# Patient Record
Sex: Female | Born: 1980 | Hispanic: Yes | Marital: Married | Smoking: Never smoker
Health system: Southern US, Community
[De-identification: ages and names within clinical notes are randomized; demographics above are authoritative.]

## PROBLEM LIST (undated history)

## (undated) DIAGNOSIS — F329 Major depressive disorder, single episode, unspecified: Secondary | ICD-10-CM

## (undated) DIAGNOSIS — F419 Anxiety disorder, unspecified: Secondary | ICD-10-CM

## (undated) DIAGNOSIS — F32A Depression, unspecified: Secondary | ICD-10-CM

## (undated) DIAGNOSIS — E119 Type 2 diabetes mellitus without complications: Secondary | ICD-10-CM

---

## 2008-04-19 ENCOUNTER — Emergency Department: Payer: Self-pay | Admitting: Emergency Medicine

## 2008-04-22 ENCOUNTER — Emergency Department: Payer: Self-pay | Admitting: Emergency Medicine

## 2008-04-26 ENCOUNTER — Emergency Department: Payer: Self-pay | Admitting: Emergency Medicine

## 2014-01-08 DIAGNOSIS — E119 Type 2 diabetes mellitus without complications: Secondary | ICD-10-CM

## 2014-01-08 DIAGNOSIS — N879 Dysplasia of cervix uteri, unspecified: Secondary | ICD-10-CM | POA: Insufficient documentation

## 2015-07-30 ENCOUNTER — Emergency Department
Admission: EM | Admit: 2015-07-30 | Discharge: 2015-07-30 | Disposition: A | Discharge status: Home / Self Care | Payer: Self-pay | Attending: Emergency Medicine | Admitting: Emergency Medicine

## 2015-07-30 ENCOUNTER — Encounter: Payer: Self-pay | Admitting: Emergency Medicine

## 2015-07-30 ENCOUNTER — Emergency Department: Payer: Self-pay

## 2015-07-30 DIAGNOSIS — E119 Type 2 diabetes mellitus without complications: Secondary | ICD-10-CM | POA: Insufficient documentation

## 2015-07-30 DIAGNOSIS — R51 Headache: Secondary | ICD-10-CM | POA: Insufficient documentation

## 2015-07-30 DIAGNOSIS — R1032 Left lower quadrant pain: Secondary | ICD-10-CM | POA: Insufficient documentation

## 2015-07-30 HISTORY — DX: Type 2 diabetes mellitus without complications: E11.9

## 2015-07-30 LAB — CBC
HEMATOCRIT: 40.2 % (ref 35.0–47.0)
HEMOGLOBIN: 13.9 g/dL (ref 12.0–16.0)
MCH: 30.7 pg (ref 26.0–34.0)
MCHC: 34.5 g/dL (ref 32.0–36.0)
MCV: 89 fL (ref 80.0–100.0)
Platelets: 281 10*3/uL (ref 150–440)
RBC: 4.52 MIL/uL (ref 3.80–5.20)
RDW: 13.3 % (ref 11.5–14.5)
WBC: 7.2 10*3/uL (ref 3.6–11.0)

## 2015-07-30 LAB — URINALYSIS COMPLETE WITH MICROSCOPIC (ARMC ONLY)
BACTERIA UA: NONE SEEN
BILIRUBIN URINE: NEGATIVE
GLUCOSE, UA: 50 mg/dL — AB
HGB URINE DIPSTICK: NEGATIVE
KETONES UR: NEGATIVE mg/dL
LEUKOCYTES UA: NEGATIVE
NITRITE: NEGATIVE
Protein, ur: 100 mg/dL — AB
RBC / HPF: NONE SEEN RBC/hpf (ref 0–5)
SPECIFIC GRAVITY, URINE: 1.014 (ref 1.005–1.030)
pH: 7 (ref 5.0–8.0)

## 2015-07-30 LAB — COMPREHENSIVE METABOLIC PANEL
ALK PHOS: 87 U/L (ref 38–126)
ALT: 19 U/L (ref 14–54)
ANION GAP: 7 (ref 5–15)
AST: 21 U/L (ref 15–41)
Albumin: 4.4 g/dL (ref 3.5–5.0)
BILIRUBIN TOTAL: 0.8 mg/dL (ref 0.3–1.2)
BUN: 10 mg/dL (ref 6–20)
CALCIUM: 9.2 mg/dL (ref 8.9–10.3)
CO2: 27 mmol/L (ref 22–32)
Chloride: 103 mmol/L (ref 101–111)
Creatinine, Ser: 0.6 mg/dL (ref 0.44–1.00)
Glucose, Bld: 232 mg/dL — ABNORMAL HIGH (ref 65–99)
POTASSIUM: 3.7 mmol/L (ref 3.5–5.1)
Sodium: 137 mmol/L (ref 135–145)
TOTAL PROTEIN: 7.4 g/dL (ref 6.5–8.1)

## 2015-07-30 LAB — PREGNANCY, URINE: PREG TEST UR: NEGATIVE

## 2015-07-30 LAB — LIPASE, BLOOD: LIPASE: 26 U/L (ref 11–51)

## 2015-07-30 MED ORDER — MORPHINE SULFATE (PF) 4 MG/ML IV SOLN
INTRAVENOUS | Status: AC
  Administered 2015-07-30: 4 mg via INTRAVENOUS
  Filled 2015-07-30: qty 1

## 2015-07-30 MED ORDER — METOCLOPRAMIDE HCL 5 MG/ML IJ SOLN
10.0000 mg | Freq: Once | INTRAMUSCULAR | Status: AC
  Administered 2015-07-30: 10 mg via INTRAVENOUS
  Filled 2015-07-30: qty 2

## 2015-07-30 MED ORDER — IOPAMIDOL (ISOVUE-300) INJECTION 61%
100.0000 mL | Freq: Once | INTRAVENOUS | Status: AC | PRN
  Administered 2015-07-30: 100 mL via INTRAVENOUS

## 2015-07-30 MED ORDER — DIATRIZOATE MEGLUMINE & SODIUM 66-10 % PO SOLN
15.0000 mL | Freq: Once | ORAL | Status: AC
  Administered 2015-07-30: 15 mL via ORAL
  Filled 2015-07-30: qty 30

## 2015-07-30 MED ORDER — ONDANSETRON 4 MG PO TBDP: 4.0000 mg | ORAL_TABLET | Freq: Four times a day (QID) | ORAL | Status: DC | PRN

## 2015-07-30 MED ORDER — SODIUM CHLORIDE 0.9 % IV BOLUS (SEPSIS)
1000.0000 mL | Freq: Once | INTRAVENOUS | Status: AC
  Administered 2015-07-30: 1000 mL via INTRAVENOUS

## 2015-07-30 MED ORDER — MORPHINE SULFATE (PF) 4 MG/ML IV SOLN
4.0000 mg | Freq: Once | INTRAVENOUS | Status: AC
  Administered 2015-07-30: 4 mg via INTRAVENOUS
  Filled 2015-07-30: qty 1

## 2015-07-30 MED ORDER — MORPHINE SULFATE (PF) 4 MG/ML IV SOLN
4.0000 mg | Freq: Once | INTRAVENOUS | Status: AC
  Administered 2015-07-30: 4 mg via INTRAVENOUS

## 2015-07-30 MED ORDER — ONDANSETRON HCL 4 MG/2ML IJ SOLN
INTRAMUSCULAR | Status: AC
  Administered 2015-07-30: 4 mg via INTRAVENOUS
  Filled 2015-07-30: qty 2

## 2015-07-30 MED ORDER — ONDANSETRON HCL 4 MG/2ML IJ SOLN
4.0000 mg | Freq: Once | INTRAMUSCULAR | Status: AC
  Administered 2015-07-30: 4 mg via INTRAVENOUS

## 2015-07-30 NOTE — ED Provider Notes (Signed)
[facility] Emergency Department Provider Note  ____________________________________________  Time seen: Approximately 10:22 AM  I have reviewed the triage vital signs and the nursing notes.   HISTORY  Chief Complaint Abdominal Pain    HPI Amy Juarez is a 35 y.o. female presents for evaluation of 2 weeks of left lower abdominal pain.  The patient reports that for the last 2 weeks she is been experiencing intermittent fever, chills, and increasing discomfort in the left lower abdomen. She has vomited a couple of times but not today. She does report she had loose watery nonbloody diarrhea a few times in the last week, but this seems to have improved. She continues to have a persistent discomfort in the left lower abdomen.  She denies any recent travel. Denies any bad food source. She does report that about a year ago she was treated for a bacteria that could've caused a stomach ulcer, and completed that course. She is not having similar symptoms to an ulcer, she is not having pain in the upper abdomen. Denies pain on the right side of the abdomen.  No vaginal bleeding or discharge. Denies pregnancy. Denies "pelvic pain".  Pain is made worse with standing and walking and movement.  Rates her pain as moderate, located in the left lower abdomen. Crampy at times it also feels like a feeling as though something is "there" hard to describe.  Past Medical History  Diagnosis Date  . Diabetes mellitus without complication (HCC)     There are no active problems to display for this patient. Patient reports type 2 diabetes  History reviewed. No pertinent past surgical history.  Current Outpatient Rx  Name  Route  Sig  Dispense  Refill  . ondansetron (ZOFRAN ODT) 4 MG disintegrating tablet   Oral   Take 1 tablet (4 mg total) by mouth every 6 (six) hours as needed for nausea or vomiting.   20 tablet   0    Patient reports she takes metformin and  one other diabetes medicine.   Allergies Review of patient's allergies indicates no known allergies.  No family history on file.  Social History Social History  Substance Use Topics  . Smoking status: Never Smoker   . Smokeless tobacco: None  . Alcohol Use: No    Review of Systems Constitutional: No fever/chills Eyes: No visual changes. ENT: No sore throat. Cardiovascular: Denies chest pain. Respiratory: Denies shortness of breath. Gastrointestinal:   No constipation. Genitourinary: Negative for dysuria. Musculoskeletal: Negative for back pain. Skin: Negative for rash. Neurological: Negative for headaches, focal weakness or numbness.  10-point ROS otherwise negative.  ____________________________________________   PHYSICAL EXAM:  VITAL SIGNS: ED Triage Vitals  Enc Vitals Group     BP 07/30/15 0958 146/97 mmHg     Pulse Rate 07/30/15 0958 84     Resp 07/30/15 0958 18     Temp 07/30/15 0958 98 F (36.7 C)     Temp src --      SpO2 07/30/15 0958 94 %     Weight 07/30/15 0958 191 lb (86.637 kg)     Height 07/30/15 0958  (1.651 m)     Head Cir --      Peak Flow --      Pain Score 07/30/15 0959 8     Pain Loc --      Pain Edu? --      Excl. in GC? --    Constitutional: Alert and oriented. Well appearing and  in no acute distress. Eyes: Conjunctivae are normal. PERRL. EOMI. Head: Atraumatic. Nose: No congestion/rhinnorhea. Mouth/Throat: Mucous membranes are moist.  Oropharynx non-erythematous. Neck: No stridor.   Cardiovascular: Normal rate, regular rhythm. Grossly normal heart sounds.  Good peripheral circulation. Respiratory: Normal respiratory effort.  No retractions. Lungs CTAB. Gastrointestinal: Soft and nontenderExcept for left lower quadrant abdominal pain, she has mild rebound discomfort in the left lower quadrant. No mass. No groin mass. No pain or discomfort or lesion/erythema overlying the hips bilateral. Normal range of motion of the hip without  pain. No pain at McBurney's point. Negative Murphy. Pain seems to be isolated primarily in the left lower quadrant.. No distention. No abdominal bruits. No CVA tenderness. Musculoskeletal: No lower extremity tenderness nor edema.  No joint effusions. Neurologic:  Normal speech and language. No gross focal neurologic deficits are appreciated. No gait instability. Skin:  Skin is warm, dry and intact. No rash noted. Psychiatric: Mood and affect are normal. Speech and behavior are normal.  ____________________________________________   LABS (all labs ordered are listed, but only abnormal results are displayed)  Labs Reviewed  COMPREHENSIVE METABOLIC PANEL - Abnormal; Notable for the following:    Glucose, Bld 232 (*)    All other components within normal limits  URINALYSIS COMPLETEWITH MICROSCOPIC ([facility] ONLY) - Abnormal; Notable for the following:    Color, Urine YELLOW (*)    APPearance CLEAR (*)    Glucose, UA 50 (*)    Protein, ur 100 (*)    Squamous Epithelial / LPF 0-5 (*)    All other components within normal limits  CBC  LIPASE, BLOOD  PREGNANCY, URINE   ____________________________________________  EKG   ____________________________________________  RADIOLOGY  CT Abdomen Pelvis W Contrast (Final result) Result time: 07/30/15 14:41:33   Final result by Rad Results In Interface (07/30/15 14:41:33)   Narrative:   CLINICAL DATA: Pt presents to ED with LLQ abdominal pain for two weeks. Pt reports N/V/D. Pt reports fever and headache.  EXAM: CT ABDOMEN AND PELVIS WITH CONTRAST  TECHNIQUE: Multidetector CT imaging of the abdomen and pelvis was performed using the standard protocol following bolus administration of intravenous contrast.  CONTRAST: 100mL ISOVUE-300 IOPAMIDOL (ISOVUE-300) INJECTION 61%  COMPARISON: Pelvic ultrasound 04/27/2008.  FINDINGS: Lower chest: Mild atelectasis at the LEFT and RIGHT lung bases. No consolidation.  Hepatobiliary: No  focal hepatic lesion. No biliary duct dilatation. Gallbladder is normal. Common bile duct is normal.  Pancreas: Pancreas is normal. No ductal dilatation. No pancreatic inflammation.  Spleen: Normal spleen  Adrenals/urinary tract: Adrenal glands and kidneys are normal. The ureters and bladder normal.  Stomach/Bowel: Stomach, small bowel, appendix, and cecum are normal. The colon and rectosigmoid colon are normal.  Vascular/Lymphatic: Abdominal aorta is normal caliber. There is no retroperitoneal or periportal lymphadenopathy. No pelvic lymphadenopathy.  Reproductive: Uterus and ovaries are normal. Tampon in the vagina.  Other: No free fluid.  Musculoskeletal: No aggressive osseous lesion.  IMPRESSION: .  1. No explanation for LEFT lower quadrant pain. No diverticulosis or diverticulitis 2. Normal gallbladder, appendix, and ovaries.   Electronically Signed By: Genevive BiStewart Edmunds M.D. On: 07/30/2015 14:41          CT Head Wo Contrast (Final result) Result time: 07/30/15 14:42:57   Final result by Rad Results In Interface (07/30/15 14:42:57)   Narrative:   CLINICAL DATA: Headaches  EXAM: CT HEAD WITHOUT CONTRAST  TECHNIQUE: Contiguous axial images were obtained from the base of the skull through the vertex without intravenous contrast.  COMPARISON: None.  FINDINGS: The bony calvarium is intact. The ventricles are of normal size and configuration. No findings to suggest acute hemorrhage, acute infarction or space-occupying mass lesion are noted. Cavum velum interpositum is noted.  IMPRESSION: No acute abnormality noted.   Electronically Signed By: Alcide Clever M.D. On: 07/30/2015 14:42       ____________________________________________   PROCEDURES  Procedure(s) performed: None  Critical Care performed: No  ____________________________________________   INITIAL IMPRESSION / ASSESSMENT AND PLAN / ED COURSE  Pertinent labs &  imaging results that were available during my care of the patient were reviewed by me and considered in my medical decision making (see chart for details).  Differential diagnosis includes but is not limited to, abdominal perforation, aortic dissection, cholecystitis, appendicitis, diverticulitis, colitis, esophagitis/gastritis, kidney stone, pyelonephritis, urinary tract infection, aortic aneurysm. All are considered in decision and treatment plan. Based upon the patient's presentation and risk factors, including increasing discomfort and intermittent fevers for a week, we'll obtain CT imaging as well as abdominal labs and urinalysis for evaluation of etiology. She does appear quite stable in the emergency room, notably no fever, and she reports her diarrhea and vomiting seemed to improve but is left with persistent discomfort in the left lower abdomen. She denies any gynecologic symptoms.  ----------------------------------------- 1:41 PM on 07/30/2015 -----------------------------------------  Patient now reports that she's having a throbbing left-sided headache. She does report she has had a long history of headaches and she has seen her doctor for this in the past, but this headache actually did start a long the last few days, but she didn't initially say anything about it because her concern today was abdominal pain. She does report she has moderate pain and headache, and her abdominal pain does feel better. I will obtain CT of the head as she reports she's never had any imaging of her brain before associated with these seemingly chronic recurrent headaches, and also proceed with CT abdomen and pelvis. She is awake alert and in no apparent distress at this time. Reglan, morphine ordered.  ----------------------------------------- 3:48 PM on 07/30/2015 -----------------------------------------  Patient reports no pain, currently resting comfortably. Very pleasant. Discussed and reviewed CT, labs  with the patient via Spanish interpreter. Careful discharge instructions advised.  Return precautions and treatment recommendations and follow-up discussed with the patient who is agreeable with the plan.  ____________________________________________   FINAL CLINICAL IMPRESSION(S) / ED DIAGNOSES  Final diagnoses:  Abdominal pain, LLQ (left lower quadrant)      Sharyn Creamer, MD 07/30/15 1549

## 2015-07-30 NOTE — ED Notes (Addendum)
MD at bedside for assessment. MD instructed patient that she must wait 48 hrs to take metformin after receiving contrast dye. Interpreter remains at bedside at this time.

## 2015-07-30 NOTE — ED Notes (Signed)
Pt sat up in bed and given CT contrast to attempt to drink. NAD noted at this time.

## 2015-07-30 NOTE — Discharge Instructions (Signed)
You were seen in the emergency room for abdominal pain. It is important that you follow up closely with your primary care doctor in the next couple of days.  If you're unable to see her primary care doctor you may return to the emergency room or go to the [facility] walk-in clinic in 1 or 2 days for reexam.  Please return to the emergency room right away if you are to develop a fever, severe nausea, your pain becomes severe or worsens, you are unable to keep food down, begin vomiting any dark or bloody fluid, you develop any dark or bloody stools, feel dehydrated, or other new concerns or symptoms arise.   Dolor abdominal en adultos (Abdominal Pain, Adult) El dolor puede tener muchas causas. Normalmente la causa del dolor abdominal no es una enfermedad y Scientist, clinical (histocompatibility and immunogenetics)mejorar sin TEFL teachertratamiento. Frecuentemente puede controlarse y tratarse en casa. Su mdico le Medical sales representativerealizar un examen fsico y posiblemente solicite anlisis de sangre y radiografas para ayudar a Chief Strategy Officerdeterminar la gravedad de su dolor. Sin embargo, en IAC/InterActiveCorpmuchos casos, debe transcurrir ms tiempo antes de que se pueda Clinical research associateencontrar una causa evidente del dolor. Antes de llegar a ese punto, es posible que su mdico no sepa si necesita ms pruebas o un tratamiento ms profundo. INSTRUCCIONES PARA EL CUIDADO EN EL HOGAR  Est atento al dolor para ver si hay cambios. Las siguientes indicaciones ayudarn a Architectural technologistaliviar cualquier molestia que pueda sentir:  [place] solo medicamentos de venta libre o recetados, segn las indicaciones del mdico.  No tome laxantes a menos que se lo haya indicado su mdico.  Pruebe con Neomia Dearuna dieta lquida absoluta (caldo, t o agua) segn se lo indique su mdico. Introduzca gradualmente una dieta normal, segn su tolerancia. SOLICITE ATENCIN MDICA SI:  Tiene dolor abdominal sin explicacin.  Tiene dolor abdominal relacionado con nuseas o diarrea.  Tiene dolor cuando orina o defeca.  Experimenta dolor abdominal que lo despierta de  noche.  Tiene dolor abdominal que empeora o mejora cuando come alimentos.  Tiene dolor abdominal que empeora cuando come alimentos grasosos.  Tiene fiebre. SOLICITE ATENCIN MDICA DE INMEDIATO SI:   El dolor no desaparece en un plazo mximo de 2horas.  No deja de (vomitar).  El Engineer, miningdolor se siente solo en partes del abdomen, como el lado derecho o la parte inferior izquierda del abdomen.  Evaca materia fecal sanguinolenta o negra, de aspecto alquitranado. ASEGRESE DE QUE:  Comprende estas instrucciones.  Controlar su afeccin.  Recibir ayuda de inmediato si no mejora o si empeora.   Esta informacin no tiene Theme park managercomo fin reemplazar el consejo del mdico. Asegrese de hacerle al mdico cualquier pregunta que tenga.   Document Released: 03/28/2005 Document Revised: 04/18/2014 Elsevier Interactive Patient Education [employer]2016 Elsevier Inc.

## 2015-07-30 NOTE — ED Notes (Signed)
Pt presents to ED with LLQ abdominal pain for two weeks. Pt reports N/V/D. Pt reports fever and headache.

## 2015-07-30 NOTE — ED Notes (Signed)
Pt taken to CT at this time. Jacqui the interpreter to accompany patient to CT.

## 2015-07-30 NOTE — ED Notes (Signed)
Interpreter to bedside with this RN and MD. Pt states she had a mild HA upon arrival and has progressed to the back of her neck. Pt states the pain is "unbearable in her neck". Pt is alert and oriented, no slurred speech, states hx of HA at this time. MD at bedside. Jacqui, interpreter at bedside for interpretation at this time.

## 2015-07-30 NOTE — ED Notes (Signed)
Pt resting in bed with eyes closed, respirations even and unlabored at this time. Will continue to monitor.

## 2015-07-30 NOTE — ED Notes (Signed)
Pt returned from CT °

## 2015-07-30 NOTE — ED Notes (Signed)
MD notified of patient's sudden onset HA. Pt noted to be tearful at this time and is rubbing her head.

## 2016-03-14 ENCOUNTER — Emergency Department
Admission: EM | Admit: 2016-03-14 | Discharge: 2016-03-14 | Disposition: A | Discharge status: Home / Self Care | Payer: Self-pay | Attending: Emergency Medicine | Admitting: Emergency Medicine

## 2016-03-14 ENCOUNTER — Encounter: Payer: Self-pay | Admitting: Emergency Medicine

## 2016-03-14 DIAGNOSIS — F419 Anxiety disorder, unspecified: Secondary | ICD-10-CM | POA: Insufficient documentation

## 2016-03-14 DIAGNOSIS — R290 Tetany: Secondary | ICD-10-CM | POA: Insufficient documentation

## 2016-03-14 DIAGNOSIS — F410 Panic disorder [episodic paroxysmal anxiety] without agoraphobia: Secondary | ICD-10-CM

## 2016-03-14 DIAGNOSIS — E119 Type 2 diabetes mellitus without complications: Secondary | ICD-10-CM | POA: Insufficient documentation

## 2016-03-14 HISTORY — DX: Major depressive disorder, single episode, unspecified: F32.9

## 2016-03-14 HISTORY — DX: Depression, unspecified: F32.A

## 2016-03-14 HISTORY — DX: Anxiety disorder, unspecified: F41.9

## 2016-03-14 MED ORDER — LORAZEPAM 1 MG PO TABS
1.0000 mg | ORAL_TABLET | Freq: Once | ORAL | Status: AC
  Administered 2016-03-14: 1 mg via ORAL
  Filled 2016-03-14: qty 1

## 2016-03-14 NOTE — ED Notes (Signed)
Pt resting quietly on stretcher with no distress noted; SO at bedside; pt reports feeling better now "just sore"

## 2016-03-14 NOTE — ED Triage Notes (Addendum)
Pt to 1H via EMS with no distress noted; [facility] pt reports "body numbness" x month; resp even/unlab, lungs clear, abd soft/nondist, +BS, strong & = periph pulses; pt st hx of anxiety and depression and takes unknown medication for such as rx; pt also reports death of her [age]yr old 2months ago which seemed to have exacerbated theses symptoms

## 2016-03-14 NOTE — ED Provider Notes (Signed)
[facility] Emergency Department Provider Note   ____________________________________________    I have reviewed the triage vital signs and the nursing notes.   HISTORY  Chief Complaint Anxiety  -Interpreter used  HPI Park LiterClorinda Fuentes Jandres is a 35 y.o. female who presents with complaints of likely anxiety attack. Patient reports she became quite anxious developed palpitations and chest tightness and felt her hands spasming inwards. She had a similar episode and [facility] within the last [facility] including MRI of the head and the neck which were unremarkable, there was some thought that this may related to diabetic neuropathy and she has follow-up arranged already with neurology and physiatry. She feels well, she denies chest pain or shortness of breath.   Past Medical History:  Diagnosis Date  . Anxiety   . Depression   . Diabetes mellitus without complication (HCC)     There are no active problems to display for this patient.   History reviewed. No pertinent surgical history.  Prior to Admission medications   Medication Sig Start Date End Date Taking? Authorizing Provider  ondansetron (ZOFRAN ODT) 4 MG disintegrating tablet Take 1 tablet (4 mg total) by mouth every 6 (six) hours as needed for nausea or vomiting. 07/30/15   Sharyn CreamerMark Quale, MD     Allergies Patient has no known allergies.  No family history on file.  Social History Social History  Substance Use Topics  . Smoking status: Never Smoker  . Smokeless tobacco: Never Used  . Alcohol use No    Review of Systems  Constitutional: NoDizziness Eyes: No visual changes.  ENT: No sore throat. Cardiovascular: As above Respiratory: As above Gastrointestinal: No abdominal pain.  No nausea, no vomiting.   Genitourinary: Negative for dysuria. Musculoskeletal: Hand spasms as above Neurological: Negative for headaches or weakness  10-point ROS otherwise  negative.  ____________________________________________   PHYSICAL EXAM:  VITAL SIGNS: ED Triage Vitals  Enc Vitals Group     BP 03/14/16 1940 116/89     Pulse Rate 03/14/16 1940 96     Resp 03/14/16 1940 20     Temp 03/14/16 1940 97.9 F (36.6 C)     Temp Source 03/14/16 1940 Oral     SpO2 03/14/16 1940 99 %     Weight 03/14/16 1954 194 lb (88 kg)     Height 03/14/16 1954 5\' 6"  (1.676 m)     Head Circumference --      Peak Flow --      Pain Score 03/14/16 1954 6     Pain Loc --      Pain Edu? --      Excl. in GC? --     Constitutional: Alert and oriented. No acute distress. Pleasant and interactive Eyes: Conjunctivae are normal.  Head: Atraumatic. Nose: No congestion/rhinnorhea. Mouth/Throat: Mucous membranes are moist.    Cardiovascular: Normal rate, regular rhythm. Grossly normal heart sounds.  Good peripheral circulation. Respiratory: Normal respiratory effort.  No retractions. Lungs CTAB. Gastrointestinal: Soft and nontender. No distention.  No CVA tenderness. Genitourinary: deferred Musculoskeletal:   Warm and well perfused Neurologic:  Normal speech and language. No gross focal neurologic deficits are appreciated.  Skin:  Skin is warm, dry and intact. No rash noted. Psychiatric: . Speech and behavior are normal.  ____________________________________________   LABS (all labs ordered are listed, but only abnormal results are displayed)  Labs Reviewed - No data to display ____________________________________________  EKG  None ____________________________________________  RADIOLOGY  None ____________________________________________   PROCEDURES  Procedure(s) performed: No    Critical Care performed: No ____________________________________________   INITIAL IMPRESSION / ASSESSMENT AND PLAN / ED COURSE  Pertinent labs & imaging results that were available during my care of the patient were reviewed by me and considered in my medical decision  making (see chart for details).  Patient is well-appearing and in no distress. She reports that these episodes began after her child died approximately 2 months ago. I suspect this is related to anxiety/panic attacks with hyperventilation causing carpopedal spasm. We will treat with by mouth Ativan and reevaluate  Clinical Course   Patient feels much better after medication, she is calm, asymptomatic. She has outpatient follow-up as described above. Do not feel we need to repeat the [facility]. We did discuss return precautions ____________________________________________   FINAL CLINICAL IMPRESSION(S) / ED DIAGNOSES  Final diagnoses:  Anxiety attack  Carpopedal spasm      NEW MEDICATIONS STARTED DURING THIS VISIT:  Discharge Medication List as of 03/14/2016 10:17 PM       Note:  This document was prepared using Dragon voice recognition software and may include unintentional dictation errors.    Jene Everyobert Kayvion Arneson, MD 03/14/16 [phone]

## 2017-07-26 DIAGNOSIS — G5603 Carpal tunnel syndrome, bilateral upper limbs: Secondary | ICD-10-CM | POA: Insufficient documentation

## 2019-06-28 ENCOUNTER — Ambulatory Visit: Payer: Self-pay | Attending: Internal Medicine

## 2019-06-29 ENCOUNTER — Ambulatory Visit: Payer: Self-pay | Attending: Internal Medicine

## 2019-06-29 DIAGNOSIS — Z23 Encounter for immunization: Secondary | ICD-10-CM

## 2019-06-29 NOTE — Progress Notes (Signed)
   Covid-19 Vaccination Clinic  Name:  Amy Juarez    MRN: [id number] DOB: 1981-04-03  06/29/2019  Ms. Amy Juarez was observed post Covid-19 immunization for 15 minutes without incident. She was provided with Vaccine Information Sheet and instruction to access the V-Safe system.   Ms. Amy Juarez was instructed to call 911 with any severe reactions post vaccine: Marland Kitchen Difficulty breathing  . Swelling of face and throat  . A fast heartbeat  . A bad rash all over body  . Dizziness and weakness   Immunizations Administered    Name Date Dose VIS Date Route   Pfizer COVID-19 Vaccine 06/29/2019 12:18 PM 0.3 mL 03/22/2019 Intramuscular   Manufacturer: [employer], [employer]   Lot: [id number]   NDC: [address]-5

## 2019-07-20 ENCOUNTER — Ambulatory Visit: Payer: Self-pay | Attending: Internal Medicine

## 2019-07-20 DIAGNOSIS — Z23 Encounter for immunization: Secondary | ICD-10-CM

## 2019-07-20 NOTE — Progress Notes (Signed)
   Covid-19 Vaccination Clinic  Name:  Amy Juarez    MRN: [id number] DOB: 13-Mar-1981  07/20/2019  Ms. Amy Juarez was observed post Covid-19 immunization for 15 minutes   without incident. She was provided with Vaccine Information Sheet and instruction to access the V-Safe system.   Ms. Amy Juarez was instructed to call 911 with any severe reactions post vaccine: Marland Kitchen Difficulty breathing  . Swelling of face and throat  . A fast heartbeat  . A bad rash all over body  . Dizziness and weakness   Immunizations Administered    Name Date Dose VIS Date Route   Pfizer COVID-19 Vaccine 07/20/2019 11:39 AM 0.3 mL 03/22/2019 Intramuscular   Manufacturer: [employer], [employer]   Lot: [id number]   NDC: [address]-3

## 2020-12-29 ENCOUNTER — Emergency Department
Admission: EM | Admit: 2020-12-29 | Discharge: 2021-01-03 | Disposition: A | Discharge status: Home / Self Care | Payer: 59 | Attending: Emergency Medicine | Admitting: Emergency Medicine

## 2020-12-29 ENCOUNTER — Emergency Department: Payer: Self-pay

## 2020-12-29 DIAGNOSIS — T50904A Poisoning by unspecified drugs, medicaments and biological substances, undetermined, initial encounter: Secondary | ICD-10-CM

## 2020-12-29 DIAGNOSIS — T424X1A Poisoning by benzodiazepines, accidental (unintentional), initial encounter: Secondary | ICD-10-CM | POA: Insufficient documentation

## 2020-12-29 DIAGNOSIS — U071 COVID-19: Secondary | ICD-10-CM | POA: Insufficient documentation

## 2020-12-29 DIAGNOSIS — E119 Type 2 diabetes mellitus without complications: Secondary | ICD-10-CM

## 2020-12-29 DIAGNOSIS — Z7984 Long term (current) use of oral hypoglycemic drugs: Secondary | ICD-10-CM | POA: Insufficient documentation

## 2020-12-29 DIAGNOSIS — X58XXXA Exposure to other specified factors, initial encounter: Secondary | ICD-10-CM | POA: Insufficient documentation

## 2020-12-29 DIAGNOSIS — F332 Major depressive disorder, recurrent severe without psychotic features: Secondary | ICD-10-CM | POA: Diagnosis present

## 2020-12-29 DIAGNOSIS — E1142 Type 2 diabetes mellitus with diabetic polyneuropathy: Secondary | ICD-10-CM | POA: Insufficient documentation

## 2020-12-29 DIAGNOSIS — T1491XA Suicide attempt, initial encounter: Secondary | ICD-10-CM | POA: Diagnosis present

## 2020-12-29 DIAGNOSIS — Y900 Blood alcohol level of less than 20 mg/100 ml: Secondary | ICD-10-CM | POA: Insufficient documentation

## 2020-12-29 LAB — COMPREHENSIVE METABOLIC PANEL
ALT: 39 U/L (ref 0–44)
AST: 51 U/L — ABNORMAL HIGH (ref 15–41)
Albumin: 4.3 g/dL (ref 3.5–5.0)
Alkaline Phosphatase: 104 U/L (ref 38–126)
Anion gap: 12 (ref 5–15)
BUN: 16 mg/dL (ref 6–20)
CO2: 22 mmol/L (ref 22–32)
Calcium: 9.2 mg/dL (ref 8.9–10.3)
Chloride: 98 mmol/L (ref 98–111)
Creatinine, Ser: 0.7 mg/dL (ref 0.44–1.00)
GFR, Estimated: >60 mL/min (ref >60)
Glucose, Bld: 323 mg/dL — ABNORMAL HIGH (ref 70–99)
Potassium: 4.1 mmol/L (ref 3.5–5.1)
Sodium: 132 mmol/L — ABNORMAL LOW (ref 135–145)
Total Bilirubin: 0.9 mg/dL (ref 0.3–1.2)
Total Protein: 8.2 g/dL — ABNORMAL HIGH (ref 6.5–8.1)

## 2020-12-29 LAB — CBC WITH DIFFERENTIAL/PLATELET
Abs Immature Granulocytes: 0.12 10*3/uL — ABNORMAL HIGH (ref 0.00–0.07)
Basophils Absolute: 0.1 10*3/uL (ref 0.0–0.1)
Basophils Relative: 1 %
Eosinophils Absolute: 0.1 10*3/uL (ref 0.0–0.5)
Eosinophils Relative: 1 %
HCT: 41.3 % (ref 36.0–46.0)
Hemoglobin: 14.6 g/dL (ref 12.0–15.0)
Immature Granulocytes: 1 %
Lymphocytes Relative: 25 %
Lymphs Abs: 2.7 10*3/uL (ref 0.7–4.0)
MCH: 31.8 pg (ref 26.0–34.0)
MCHC: 35.4 g/dL (ref 30.0–36.0)
MCV: 90 fL (ref 80.0–100.0)
Monocytes Absolute: 0.6 10*3/uL (ref 0.1–1.0)
Monocytes Relative: 5 %
Neutro Abs: 7.2 10*3/uL (ref 1.7–7.7)
Neutrophils Relative %: 67 %
Platelets: 387 10*3/uL (ref 150–400)
RBC: 4.59 MIL/uL (ref 3.87–5.11)
RDW: 12.9 % (ref 11.5–15.5)
WBC: 10.7 10*3/uL — ABNORMAL HIGH (ref 4.0–10.5)
nRBC: 0 % (ref 0.0–0.2)

## 2020-12-29 LAB — URINE DRUG SCREEN, QUALITATIVE (ARMC ONLY)
Amphetamines, Ur Screen: NOT DETECTED
Barbiturates, Ur Screen: NOT DETECTED
Benzodiazepine, Ur Scrn: POSITIVE — AB
Cannabinoid 50 Ng, Ur ~~LOC~~: NOT DETECTED
Cocaine Metabolite,Ur ~~LOC~~: NOT DETECTED
MDMA (Ecstasy)Ur Screen: NOT DETECTED
Methadone Scn, Ur: NOT DETECTED
Opiate, Ur Screen: NOT DETECTED
Phencyclidine (PCP) Ur S: NOT DETECTED
Tricyclic, Ur Screen: NOT DETECTED

## 2020-12-29 LAB — URINALYSIS, ROUTINE W REFLEX MICROSCOPIC
Bilirubin Urine: NEGATIVE
Glucose, UA: 500 mg/dL — AB
Hgb urine dipstick: NEGATIVE
Ketones, ur: 80 mg/dL — AB
Leukocytes,Ua: NEGATIVE
Nitrite: NEGATIVE
Protein, ur: NEGATIVE mg/dL
Specific Gravity, Urine: >1.03 — ABNORMAL HIGH (ref 1.005–1.030)
pH: 6 (ref 5.0–8.0)

## 2020-12-29 LAB — HCG, QUANTITATIVE, PREGNANCY: hCG, Beta Chain, Quant, S: <1 m[IU]/mL (ref <5)

## 2020-12-29 LAB — CBG MONITORING, ED: Glucose-Capillary: 314 mg/dL — ABNORMAL HIGH (ref 70–99)

## 2020-12-29 LAB — MAGNESIUM: Magnesium: 2.1 mg/dL (ref 1.7–2.4)

## 2020-12-29 LAB — T4, FREE: Free T4: 0.74 ng/dL (ref 0.61–1.12)

## 2020-12-29 LAB — TSH: TSH: 0.834 u[IU]/mL (ref 0.350–4.500)

## 2020-12-29 MED ORDER — SODIUM CHLORIDE 0.9 % IV BOLUS
1000.0000 mL | Freq: Once | INTRAVENOUS | Status: AC
  Administered 2020-12-29: 1000 mL via INTRAVENOUS

## 2020-12-29 MED ORDER — NALOXONE HCL 2 MG/2ML IJ SOSY: 0.4000 mg | PREFILLED_SYRINGE | Freq: Once | INTRAMUSCULAR | Status: DC

## 2020-12-29 NOTE — ED Triage Notes (Signed)
Pt brought into ED by family due to medication reaction post ingestion of Clonazepam pills this AM. Writer witnessed pt go limp while family trying to get pt into wheelchair. Pt snoring and needing sternal rub to wake. Pt reactive to painful stimuli. Unknown amount of drug taken. Ammonia stimulant used with positive reaction but pt not waking enough to communicate.    Per family, pt found medication bottle and ingested all of what was left in the bottle in which was approx 1/2 full. Unknown what time ingested. Spouse reports this evening he noticed her stumbling around and lethargic. Spouse reports no hx/o drug or alcohol use. Denies previous OD or attempted SI. Spouse sts they had an argument on Saturday but pt has been normal since argument.    Medication bought from individual in neighborhood that sells medications. Spouse with medication packet. Pt with hx/o DM. Family reports she takes medication for DM, depression and stomach.

## 2020-12-29 NOTE — ED Provider Notes (Addendum)
[facility] Emergency Department Provider Note  ____________________________________________   Event Date/Time   First MD Initiated Contact with Patient 12/29/20 2227     (approximate)  I have reviewed the triage vital signs and the nursing notes.   HISTORY  Chief Complaint Drug Overdose    HPI Amy Juarez is a 40 y.o. female with diabetes, with medications as listed below who comes in with concern for overdose.  There is concern that sometime this morning patient might of taken some clonazepam pills.  These were obtained by a neighbor who gets them from [place].  Unclear how much she took or what was even left in the bottle.  Reportedly the bottle had been with him for a year and she does not take it daily but they think that that she took some today because they found the insert laying on the bed.  Spouse found her stumbling around and more lethargic.  No history of drug or alcohol use.  They did get an argument on Saturday but no known SI.  Unable to get full HPI from patient due to altered mental status   canagliflozin (INVOKANA ORAL)   Take by mouth.   0      etonogestrel (NEXPLANON) 68 mg Impl   68 mg by Subdermal route once.   0      gabapentin (NEURONTIN) 300 MG capsule   Indications: Diabetic polyneuropathy associated with type 2 diabetes mellitus (CMS-HCC) TAKE 1 CAPSULE BY MOUTH NIGHTLY 30 capsule   4 03/20/2019    metFORMIN (GLUCOPHAGE) 1000 MG tablet   Take 1,000 mg by mouth.   0 12/04/2008    sitagliptin phosphate (JANUVIA ORAL)                   Past Medical History:  Diagnosis Date   Anxiety    Depression    Diabetes mellitus without complication (HCC)       Prior to Admission medications   Medication Sig Start Date End Date Taking? Authorizing Provider  ondansetron (ZOFRAN ODT) 4 MG disintegrating tablet Take 1 tablet (4 mg total) by mouth every 6 (six) hours as needed for nausea or vomiting. 07/30/15   Sharyn Creamer,  MD    Allergies Patient has no known allergies.  No family history on file.  Social History Social History   Tobacco Use   Smoking status: Never   Smokeless tobacco: Never  Substance Use Topics   Alcohol use: No      Review of Systems Unable to get full review of systems due to altered mental status  ________________________   PHYSICAL EXAM:  VITAL SIGNS: ED Triage Vitals [12/29/20 2214]  Enc Vitals Group     BP (!) 130/95     Pulse Rate (!) 118     Resp 12     Temp 98.9 F (37.2 C)     Temp Source Oral     SpO2 98 %     Weight      Height      Head Circumference      Peak Flow      Pain Score      Pain Loc      Pain Edu?      Excl. in GC?     Constitutional: Patient is altered, unresponsive Eyes: Conjunctivae are normal.  Pupils reactive Head: Atraumatic. Nose: No congestion/rhinnorhea. Mouth/Throat: Mucous membranes are moist.   Neck: No stridor. Trachea Midline. FROM Cardiovascular: Normal rate, regular rhythm.  Grossly normal heart sounds.  Good peripheral circulation. Respiratory: Normal respiratory effort.  No retractions. Lungs CTAB.  Patient is snoring Gastrointestinal: Soft and nontender. No distention. No abdominal bruits.  Musculoskeletal: No lower extremity tenderness nor edema.  No joint effusions. Neurologic: Patient is snoring and was able to stand up and help get herself into bed and was able to hold her breath when ammonia was placed on her nose and then did respond to ammonia salts  Skin:  Skin is warm, dry and intact. No rash noted. Psychiatric: Unable to fully assess GU: Deferred   ____________________________________________   LABS (all labs ordered are listed, but only abnormal results are displayed)  Labs Reviewed  CBG MONITORING, ED - Abnormal; Notable for the following components:      Result Value   Glucose-Capillary 314 (*)    All other components within normal limits  CBC WITH DIFFERENTIAL/PLATELET  COMPREHENSIVE  METABOLIC PANEL  ACETAMINOPHEN LEVEL  SALICYLATE LEVEL  MAGNESIUM  TSH  T4, FREE  ETHANOL  URINE DRUG SCREEN, QUALITATIVE ([facility] ONLY)  URINALYSIS, ROUTINE W REFLEX MICROSCOPIC  HCG, QUANTITATIVE, PREGNANCY   ____________________________________________   ED ECG REPORT I, Concha Se, the attending physician, personally viewed and interpreted this ECG.  EKG normal sinus, no ST elevation, no T wave version, normal ____________________________________________  RADIOLOGY Pending  ____________________________________________   PROCEDURES  Procedure(s) performed (including Critical Care):  .1-3 Lead EKG Interpretation Performed by: Concha Se, MD Authorized by: Concha Se, MD     Interpretation: abnormal     ECG rate:  100s   ECG rate assessment: tachycardic     Rhythm: sinus tachycardia     Ectopy: none     Conduction: normal     ____________________________________________   INITIAL IMPRESSION / ASSESSMENT AND PLAN / ED COURSE  Amy Juarez was evaluated in Emergency Department on 12/29/2020 for the symptoms described in the history of present illness. She was evaluated in the context of the global COVID-19 pandemic, which necessitated consideration that the patient might be at risk for infection with the SARS-CoV-2 virus that causes COVID-19. Institutional protocols and algorithms that pertain to the evaluation of patients at risk for COVID-19 are in a state of rapid change based on information released by regulatory bodies including the CDC and federal and state organizations. These policies and algorithms were followed during the patient's care in the ED.    Patient comes altered with concern for drug overdose.  Unclear exactly what she took but eventually some clonazepam.  Work-up was initiated to look for Electra abnormalities, AKI, aspirin, Tylenol overdose.  Patient will be kept on end-tidal and pulse ox to evaluate for need for intubation but  patient seem to be responding to ammonia salts and was later more responsive and able to say her name so we can hold off on intubation at this time.  We will add on CT head to make sure no evidence of intercranial hemorrhage.  Patient be handed off to oncoming team pending these results.  Will place on IVC given unclear if SI at  11:28 PM reevaluated patient and she does report taking some clonazepam this morning.  She states that she just want to fall asleep because she just did not feel good.  She states that she has depression and a lot of anxiety.  When asked if it was SI she just stated that she just wanted to go to sleep.  Given this I am concerned about the potential of  SI and I have IVC patient and placed a psych consult patient handed off to oncoming team pending labs  The patient has been placed in psychiatric observation due to the need to provide a safe environment for the patient while obtaining psychiatric consultation and evaluation, as well as ongoing medical and medication management to treat the patient's condition.  The patient has been placed under full IVC at this time.       ____________________________________________   FINAL CLINICAL IMPRESSION(S) / ED DIAGNOSES   Final diagnoses:  Drug overdose, undetermined intent, initial encounter      MEDICATIONS GIVEN DURING THIS VISIT:  Medications  sodium chloride 0.9 % bolus 1,000 mL (has no administration in time range)  naloxone [facility]) injection 0.4 mg (has no administration in time range)     ED Discharge Orders     None        Note:  This document was prepared using Dragon voice recognition software and may include unintentional dictation errors.    Concha Se, MD 12/29/20 2330    Concha Se, MD 12/29/20 2330

## 2020-12-29 NOTE — ED Provider Notes (Signed)
-----------------------------------------   11:14 PM on 12/29/2020 -----------------------------------------  Assuming care from Dr. Fuller Plan.  In short, Amy Juarez is a 40 y.o. female with a chief complaint of AMS, probable OD.  Refer to the original H&P for additional details.  The current plan of care is to follow up labs and imaging and monitor mental status.  Patient is under IVC.   ----------------------------------------- 3:53 AM on 12/30/2020 -----------------------------------------  Patient awake, alert, oriented, medically cleared.  Lab results and imaging are all reassuring except that she is COVID-positive but essentially asymptomatic.  Vital signs are stable and within normal limits.  No airway difficulties.  She was evaluated in person by psychiatry and Annice Pih recommends psychiatry admission once she is cleared from her COVID infection.  Discontinuing medical monitoring when psych room is available.   Loleta Rose, MD 12/30/20 [phone]

## 2020-12-30 DIAGNOSIS — F332 Major depressive disorder, recurrent severe without psychotic features: Secondary | ICD-10-CM | POA: Diagnosis not present

## 2020-12-30 DIAGNOSIS — E119 Type 2 diabetes mellitus without complications: Secondary | ICD-10-CM

## 2020-12-30 DIAGNOSIS — T1491XA Suicide attempt, initial encounter: Secondary | ICD-10-CM | POA: Diagnosis present

## 2020-12-30 LAB — ETHANOL: Alcohol, Ethyl (B): <10 mg/dL (ref <10)

## 2020-12-30 LAB — CBG MONITORING, ED
Glucose-Capillary: 274 mg/dL — ABNORMAL HIGH (ref 70–99)
Glucose-Capillary: 376 mg/dL — ABNORMAL HIGH (ref 70–99)
Glucose-Capillary: 326 mg/dL — ABNORMAL HIGH (ref 70–99)
Glucose-Capillary: 274 mg/dL — ABNORMAL HIGH (ref 70–99)

## 2020-12-30 LAB — SALICYLATE LEVEL: Salicylate Lvl: <7 mg/dL — ABNORMAL LOW (ref 7.0–30.0)

## 2020-12-30 LAB — RESP PANEL BY RT-PCR (FLU A&B, COVID) ARPGX2
Influenza A by PCR: NEGATIVE
Influenza B by PCR: NEGATIVE
SARS Coronavirus 2 by RT PCR: POSITIVE — AB

## 2020-12-30 LAB — TROPONIN I (HIGH SENSITIVITY)
Troponin I (High Sensitivity): 3 ng/L (ref <18)
Troponin I (High Sensitivity): <2 ng/L (ref <18)

## 2020-12-30 LAB — ACETAMINOPHEN LEVEL: Acetaminophen (Tylenol), Serum: <10 ug/mL — ABNORMAL LOW (ref 10–30)

## 2020-12-30 MED ORDER — INSULIN ASPART 100 UNIT/ML IJ SOLN
0.0000 [IU] | Freq: Three times a day (TID) | INTRAMUSCULAR | Status: DC
Start: 2020-12-30 — End: 2020-12-30
  Administered 2020-12-30: 15 [IU] via SUBCUTANEOUS
  Filled 2020-12-30: qty 1

## 2020-12-30 MED ORDER — INSULIN ASPART 100 UNIT/ML IJ SOLN
0.0000 [IU] | Freq: Three times a day (TID) | INTRAMUSCULAR | Status: DC
  Administered 2020-12-30: 11 [IU] via SUBCUTANEOUS
  Administered 2020-12-31: 8 [IU] via SUBCUTANEOUS
  Administered 2020-12-31: 15 [IU] via SUBCUTANEOUS
  Administered 2021-01-01 (×2): 11 [IU] via SUBCUTANEOUS
  Administered 2021-01-01: 8 [IU] via SUBCUTANEOUS
  Administered 2021-01-02: 11 [IU] via SUBCUTANEOUS
  Administered 2021-01-02: 3 [IU] via SUBCUTANEOUS
  Administered 2021-01-02 – 2021-01-03 (×2): 8 [IU] via SUBCUTANEOUS
  Administered 2021-01-03: 11 [IU] via SUBCUTANEOUS
  Filled 2020-12-30 (×10): qty 1

## 2020-12-30 MED ORDER — INSULIN ASPART 100 UNIT/ML IJ SOLN
0.0000 [IU] | Freq: Every day | INTRAMUSCULAR | Status: DC
Start: 2020-12-30 — End: 2021-01-03
  Administered 2020-12-30: 3 [IU] via SUBCUTANEOUS
  Administered 2020-12-31 – 2021-01-01 (×2): 5 [IU] via SUBCUTANEOUS
  Administered 2021-01-02: 3 [IU] via SUBCUTANEOUS
  Filled 2020-12-30 (×4): qty 1

## 2020-12-30 NOTE — Consult Note (Signed)
[facility] Face-to-Face Psychiatry Consult   Reason for Consult: Drug Overdose Referring Physician: Dr. Fuller Plan Patient Identification: Amy Juarez MRN:  [id number] Principal Diagnosis: <principal problem not specified> Diagnosis:  Active Problems:   MDD (major depressive disorder), recurrent episode, severe (HCC)   Suicide attempt (HCC)   Diabetes (HCC)   Total Time spent with patient: 1 hour  Subjective: "I took 20 pills because I was trying to kill myself." Amy Juarez is a 40 y.o. female patient presented to [facility] ED via POV was initially voluntary and placed under the EDP's Involuntary Commitment status (IVC). Spanish interpreter services were used to communicate with the patient during the patient assessment. The patient shared through the interpreter that she has been depressed for about ten years. She stated that her daughter was born with a brain infection, and the baby lived for five years. She shared that the baby passed away five years ago. The patient shared that she goes to therapy but does not trust them.  The patient stated that she purchased some unknown medication from [employer]. She shared she does not know the name of the medication. She discussed that the medication direction said to take one tab for sleep, and she took 20 tabs. The patient was asked, were you trying to end your life? The patient nodded "Yes" and responded to the interpreter, "Yes." The interperter "Yes."  The patient was seen face-to-face by this provider; the chart was reviewed and consulted with Dr. York Cerise on 12/30/2020 due to the patient's care. It was discussed with the EDP that the patient does meet the criteria to be admitted to the psychiatric inpatient unit.  On evaluation, the patient is alert and oriented x 4, calm, cooperative, and mood-congruent with affect. The patient does not appear to be responding to internal or external stimuli. Neither is the patient presenting with any  delusional thinking. The patient denies auditory or visual hallucinations. The patient admits to suicidal ideation but denies homicidal or self-harm ideations. The patient is not presenting with any psychotic or paranoid behaviors.   HPI: Per Dr. Fuller Plan, Amy Juarez is a 40 y.o. female with diabetes, with medications as listed below who comes in with concern for overdose.  There is concern that sometime this morning patient might of taken some clonazepam pills.  These were obtained by a neighbor who gets them from [place].  Unclear how much she took or what was even left in the bottle.  Reportedly the bottle had been with him for a year and she does not take it daily but they think that that she took some today because they found the insert laying on the bed.  Spouse found her stumbling around and more lethargic.  No history of drug or alcohol use.  They did get an argument on Saturday but no known SI.   Unable to get full HPI from patient due to altered mental status  Past Psychiatric History:  Anxiety Depression  Risk to Self:   Risk to Others:   Prior Inpatient Therapy:   Prior Outpatient Therapy:    Past Medical History:  Past Medical History:  Diagnosis Date   Anxiety    Depression    Diabetes mellitus without complication (HCC)    No past surgical history on file. Family History: No family history on file. Family Psychiatric  History:  Social History:  Social History   Substance and Sexual Activity  Alcohol Use No     Social History   Substance and  Sexual Activity  Drug Use Not on file    Social History   Socioeconomic History   Marital status: Married    Spouse name: Not on file   Number of children: Not on file   Years of education: Not on file   Highest education level: Not on file  Occupational History   Not on file  Tobacco Use   Smoking status: Never   Smokeless tobacco: Never  Substance and Sexual Activity   Alcohol use: No   Drug use: Not on  file   Sexual activity: Not on file  Other Topics Concern   Not on file  Social History Narrative   Not on file   Social Determinants of Health   Financial Resource Strain: Not on file  Food Insecurity: Not on file  Transportation Needs: Not on file  Physical Activity: Not on file  Stress: Not on file  Social Connections: Not on file   Additional Social History:    Allergies:  No Known Allergies  Labs:  Results for orders placed or performed during the hospital encounter of 12/29/20 (from the past 48 hour(s))  Resp Panel by RT-PCR (Flu A&B, Covid) Nasopharyngeal Swab     Status: Abnormal   Collection Time: 12/29/20  1:50 AM   Specimen: Nasopharyngeal Swab; Nasopharyngeal(NP) swabs in vial transport medium  Result Value Ref Range   SARS Coronavirus 2 by RT PCR POSITIVE (A) NEGATIVE    Comment: RESULT CALLED TO, READ BACK BY AND VERIFIED WITHSharren Bridge RN [phone] 12/30/20 HNM (NOTE) SARS-CoV-2 target nucleic acids are DETECTED.  The SARS-CoV-2 RNA is generally detectable in upper respiratory specimens during the acute phase of infection. Positive results are indicative of the presence of the identified virus, but do not rule out bacterial infection or co-infection with other pathogens not detected by the test. Clinical correlation with patient history and other diagnostic information is necessary to determine patient infection status. The expected result is Negative.  Fact Sheet for Patients: BloggerCourse.com  Fact Sheet for Healthcare Providers: SeriousBroker.it  This test is not yet approved or cleared by the [place] FDA and  has been authorized for detection and/or diagnosis of SARS-CoV-2 by FDA under an Emergency Use Authorization (EUA).  This EUA will remain in effect (meaning this test can b e used) for the duration of  the COVID-19 declaration under Section 564(b)(1) of the Act, 21 U.S.C. section  360bbb-3(b)(1), unless the authorization is terminated or revoked sooner.     Influenza A by PCR NEGATIVE NEGATIVE   Influenza B by PCR NEGATIVE NEGATIVE    Comment: (NOTE) The Xpert Xpress SARS-CoV-2/FLU/RSV plus assay is intended as an aid in the diagnosis of influenza from Nasopharyngeal swab specimens and should not be used as a sole basis for treatment. Nasal washings and aspirates are unacceptable for Xpert Xpress SARS-CoV-2/FLU/RSV testing.  Fact Sheet for Patients: BloggerCourse.com  Fact Sheet for Healthcare Providers: SeriousBroker.it  This test is not yet approved or cleared by the [place] FDA and has been authorized for detection and/or diagnosis of SARS-CoV-2 by FDA under an Emergency Use Authorization (EUA). This EUA will remain in effect (meaning this test can be used) for the duration of the COVID-19 declaration under Section 564(b)(1) of the Act, 21 U.S.C. section 360bbb-3(b)(1), unless the authorization is terminated or revoked.  Performed at [facility], [address] [place], [place] [address]   CBG monitoring, ED     Status: Abnormal   Collection Time: 12/29/20 10:25  PM  Result Value Ref Range   Glucose-Capillary 314 (H) 70 - 99 mg/dL    Comment: Glucose reference range applies only to samples taken after fasting for at least 8 hours.  CBC with Differential     Status: Abnormal   Collection Time: 12/29/20 10:49 PM  Result Value Ref Range   WBC 10.7 (H) 4.0 - 10.5 K/uL   RBC 4.59 3.87 - 5.11 MIL/uL   Hemoglobin 14.6 12.0 - 15.0 g/dL   HCT 60.4 54.0 - 98.1 %   MCV 90.0 80.0 - 100.0 fL   MCH 31.8 26.0 - 34.0 pg   MCHC 35.4 30.0 - 36.0 g/dL   RDW 19.1 47.8 - 29.5 %   Platelets 387 150 - 400 K/uL   nRBC 0.0 0.0 - 0.2 %   Neutrophils Relative % 67 %   Neutro Abs 7.2 1.7 - 7.7 K/uL   Lymphocytes Relative 25 %   Lymphs Abs 2.7 0.7 - 4.0 K/uL   Monocytes Relative 5 %   Monocytes  Absolute 0.6 0.1 - 1.0 K/uL   Eosinophils Relative 1 %   Eosinophils Absolute 0.1 0.0 - 0.5 K/uL   Basophils Relative 1 %   Basophils Absolute 0.1 0.0 - 0.1 K/uL   Immature Granulocytes 1 %   Abs Immature Granulocytes 0.12 (H) 0.00 - 0.07 K/uL    Comment: Performed at [facility], [address] [place], [place] [address]  Comprehensive metabolic panel     Status: Abnormal   Collection Time: 12/29/20 10:49 PM  Result Value Ref Range   Sodium 132 (L) 135 - 145 mmol/L   Potassium 4.1 3.5 - 5.1 mmol/L   Chloride 98 98 - 111 mmol/L   CO2 22 22 - 32 mmol/L   Glucose, Bld 323 (H) 70 - 99 mg/dL    Comment: Glucose reference range applies only to samples taken after fasting for at least 8 hours.   BUN 16 6 - 20 mg/dL   Creatinine, Ser 8.65 0.44 - 1.00 mg/dL   Calcium 9.2 8.9 - 78.4 mg/dL   Total Protein 8.2 (H) 6.5 - 8.1 g/dL   Albumin 4.3 3.5 - 5.0 g/dL   AST 51 (H) 15 - 41 U/L   ALT 39 0 - 44 U/L   Alkaline Phosphatase 104 38 - 126 U/L   Total Bilirubin 0.9 0.3 - 1.2 mg/dL   GFR, Estimated >69 >62 mL/min    Comment: (NOTE) Calculated using the CKD-EPI Creatinine Equation (2021)    Anion gap 12 5 - 15    Comment: Performed at [facility], [address], [place], [place] [address]  Magnesium     Status: None   Collection Time: 12/29/20 10:49 PM  Result Value Ref Range   Magnesium 2.1 1.7 - 2.4 mg/dL    Comment: Performed at [facility], [address] [place], [place] [address]  TSH     Status: None   Collection Time: 12/29/20 10:49 PM  Result Value Ref Range   TSH 0.834 0.350 - 4.500 uIU/mL    Comment: Performed by a 3rd Generation assay with a functional sensitivity of <=0.01 uIU/mL. Performed at [facility], [address] [place], [place] [address]   T4, free     Status: None   Collection Time: 12/29/20 10:49 PM  Result Value Ref Range   Free T4 0.74 0.61 - 1.12 ng/dL    Comment: (NOTE) Biotin ingestion may interfere with  free T4 tests. If  the results are inconsistent with the TSH level, previous test results, or the clinical presentation, then consider biotin interference. If needed, order repeat testing after stopping biotin. Performed at [facility], [address] [place], [place] [address]   Troponin I (High Sensitivity)     Status: None   Collection Time: 12/29/20 10:49 PM  Result Value Ref Range   Troponin I (High Sensitivity) 3 <18 ng/L    Comment: (NOTE) Elevated high sensitivity troponin I (hsTnI) values and significant  changes across serial measurements may suggest ACS but many other  chronic and acute conditions are known to elevate hsTnI results.  Refer to the "Links" section for chest pain algorithms and additional  guidance. Performed at [facility], [address] [place], [place] [address]   Urine Drug Screen, Qualitative [facility] only)     Status: Abnormal   Collection Time: 12/29/20 10:53 PM  Result Value Ref Range   Tricyclic, Ur Screen NONE DETECTED NONE DETECTED   Amphetamines, Ur Screen NONE DETECTED NONE DETECTED   MDMA (Ecstasy)Ur Screen NONE DETECTED NONE DETECTED   Cocaine Metabolite,Ur [place] NONE DETECTED NONE DETECTED   Opiate, Ur Screen NONE DETECTED NONE DETECTED   Phencyclidine (PCP) Ur S NONE DETECTED NONE DETECTED   Cannabinoid 50 Ng, Ur [place] NONE DETECTED NONE DETECTED   Barbiturates, Ur Screen NONE DETECTED NONE DETECTED   Benzodiazepine, Ur Scrn POSITIVE (A) NONE DETECTED   Methadone Scn, Ur NONE DETECTED NONE DETECTED    Comment: (NOTE) Tricyclics + metabolites, urine    Cutoff 1000 ng/mL Amphetamines + metabolites, urine  Cutoff 1000 ng/mL MDMA (Ecstasy), urine              Cutoff 500 ng/mL Cocaine Metabolite, urine          Cutoff 300 ng/mL Opiate + metabolites, urine        Cutoff 300 ng/mL Phencyclidine (PCP), urine         Cutoff 25 ng/mL Cannabinoid, urine                 Cutoff 50 ng/mL Barbiturates + metabolites, urine  Cutoff 200  ng/mL Benzodiazepine, urine              Cutoff 200 ng/mL Methadone, urine                   Cutoff 300 ng/mL  The urine drug screen provides only a preliminary, unconfirmed analytical test result and should not be used for non-medical purposes. Clinical consideration and professional judgment should be applied to any positive drug screen result due to possible interfering substances. A more specific alternate chemical method must be used in order to obtain a confirmed analytical result. Gas chromatography / mass spectrometry (GC/MS) is the preferred confirm atory method. Performed at [facility], [address] [place], [place] [address]   Urinalysis, Routine w reflex microscopic     Status: Abnormal   Collection Time: 12/29/20 10:53 PM  Result Value Ref Range   Color, Urine YELLOW YELLOW    Comment: YELLOW   APPearance CLEAR CLEAR    Comment: CLEAR   Specific Gravity, Urine >1.030 (H) 1.005 - 1.030   pH 6.0 5.0 - 8.0   Glucose, UA 500 (A) NEGATIVE mg/dL   Hgb urine dipstick NEGATIVE NEGATIVE   Bilirubin Urine NEGATIVE NEGATIVE   Ketones, ur 80 (A) NEGATIVE mg/dL   Protein, ur NEGATIVE NEGATIVE mg/dL   Nitrite NEGATIVE NEGATIVE   Leukocytes,Ua NEGATIVE NEGATIVE  Comment: Microscopic not done on urines with negative protein, blood, leukocytes, nitrite, or glucose < 500 mg/dL. Performed at [facility], [address] [place], [place] [address]   hCG, quantitative, pregnancy     Status: None   Collection Time: 12/29/20 10:55 PM  Result Value Ref Range   hCG, Beta Chain, Quant, S <1 <5 mIU/mL    Comment:          GEST. AGE      CONC.  (mIU/mL)   <=1 WEEK        5 - 50     2 WEEKS       50 - 500     3 WEEKS       100 - 10,000     4 WEEKS     1,000 - 30,000     5 WEEKS     3,500 - 115,000   6-8 WEEKS     12,000 - 270,000    12 WEEKS     15,000 - 220,000        FEMALE AND NON-PREGNANT FEMALE:     LESS THAN 5 mIU/mL Performed at [facility], [address] [place], [place] [address]   Acetaminophen level     Status: Abnormal   Collection Time: 12/29/20 11:45 PM  Result Value Ref Range   Acetaminophen (Tylenol), Serum <10 (L) 10 - 30 ug/mL    Comment: (NOTE) Therapeutic concentrations vary significantly. A range of 10-30 ug/mL  may be an effective concentration for many patients. However, some  are best treated at concentrations outside of this range. Acetaminophen concentrations >150 ug/mL at 4 hours after ingestion  and >50 ug/mL at 12 hours after ingestion are often associated with  toxic reactions.  Performed at [facility], [address] [place], [place] [address]   Ethanol     Status: None   Collection Time: 12/29/20 11:45 PM  Result Value Ref Range   Alcohol, Ethyl (B) <10 <10 mg/dL    Comment: (NOTE) Lowest detectable limit for serum alcohol is 10 mg/dL.  For medical purposes only. Performed at [facility], [address] [place], [place] [address]   Salicylate level     Status: Abnormal   Collection Time: 12/29/20 11:45 PM  Result Value Ref Range   Salicylate Lvl <7.0 (L) 7.0 - 30.0 mg/dL    Comment: Performed at [facility], [address] [place], [place] [address]  Troponin I (High Sensitivity)     Status: None   Collection Time: 12/30/20  1:50 AM  Result Value Ref Range   Troponin I (High Sensitivity) <2 <18 ng/L    Comment: (NOTE) Elevated high sensitivity troponin I (hsTnI) values and significant  changes across serial measurements may suggest ACS but many other  chronic and acute conditions are known to elevate hsTnI results.  Refer to the "Links" section for chest pain algorithms and additional  guidance. Performed at [facility], [address] [place], [place] [address]     No current facility-administered medications for this encounter.   Current Outpatient Medications  Medication Sig Dispense Refill   atorvastatin (LIPITOR) 40  MG tablet Take 40 mg by mouth at bedtime.     DIFLUCAN 150 MG tablet Take by mouth.     FLUoxetine HCl 60 MG TABS Take 1 tablet by mouth daily.     hydrOXYzine (ATARAX/VISTARIL) 25 MG tablet Take 25 mg by mouth at bedtime as needed.  ibuprofen (ADVIL) 800 MG tablet Take 800 mg by mouth every 8 (eight) hours as needed.     INVOKANA 300 MG TABS tablet Take 300 mg by mouth daily.     LEVEMIR FLEXTOUCH 100 UNIT/ML FlexTouch Pen Inject 55 Units into the skin in the morning and at bedtime.     metFORMIN (GLUCOPHAGE) 1000 MG tablet Take 1,000 mg by mouth 2 (two) times daily.     omeprazole (PRILOSEC) 20 MG capsule Take 1 capsule by mouth daily.     ondansetron (ZOFRAN ODT) 4 MG disintegrating tablet Take 1 tablet (4 mg total) by mouth every 6 (six) hours as needed for nausea or vomiting. 20 tablet 0   ondansetron (ZOFRAN) 8 MG tablet Take 8 mg by mouth 3 (three) times daily.     tiZANidine (ZANAFLEX) 4 MG tablet Take 4 mg by mouth 2 (two) times daily as needed.     VICTOZA 18 MG/3ML SOPN Inject into the skin.      Musculoskeletal: Strength & Muscle Tone: within normal limits Gait & Station: normal Patient leans: N/A  Psychiatric Specialty Exam:  Presentation  General Appearance: Appropriate for Environment  Eye Contact:Good  Speech:Slow; Clear and Coherent  Speech Volume:Decreased  Handedness:Right   Mood and Affect  Mood:Labile; Depressed  Affect:Flat; Congruent; Depressed   Thought Process  Thought Processes:Coherent  Descriptions of Associations:Intact  Orientation:Full (Time, Place and Person)  Thought Content:Logical  History of Schizophrenia/Schizoaffective disorder:No data recorded Duration of Psychotic Symptoms:No data recorded Hallucinations:Hallucinations: None  Ideas of Reference:None  Suicidal Thoughts:Suicidal Thoughts: Yes, Active SI Active Intent and/or Plan: With Intent; With Plan; With Means to Carry Out; With Access to Means  Homicidal  Thoughts:Homicidal Thoughts: No   Sensorium  Memory:Immediate Good; Recent Good; Remote Good  Judgment:Poor  Insight:Poor   Executive Functions  Concentration:Fair  Attention Span:Fair  Recall:Fair  Fund of Knowledge:Fair  Language:Fair; Good   Psychomotor Activity  Psychomotor Activity:Psychomotor Activity: Normal   Assets  Assets:Communication Skills; Resilience; Social Support; Physical Health   Sleep  Sleep:Sleep: Poor   Physical Exam: Physical Exam Vitals and nursing note reviewed.  Constitutional:      Appearance: Normal appearance. She is obese.  HENT:     Head: Normocephalic and atraumatic.     Right Ear: External ear normal.     Left Ear: External ear normal.     Nose: Nose normal.     Mouth/Throat:     Mouth: Mucous membranes are moist.  Cardiovascular:     Rate and Rhythm: Normal rate.     Pulses: Normal pulses.  Pulmonary:     Effort: Pulmonary effort is normal.  Musculoskeletal:        General: Normal range of motion.     Cervical back: Normal range of motion and neck supple.  Neurological:     General: No focal deficit present.     Mental Status: She is alert and oriented to person, place, and time. Mental status is at baseline.  Psychiatric:        Attention and Perception: Attention and perception normal.        Mood and Affect: Mood is anxious and depressed. Affect is blunt.        Speech: Speech normal.        Behavior: Behavior is slowed. Behavior is cooperative.        Thought Content: Thought content includes suicidal ideation. Thought content includes suicidal plan.        Cognition and Memory: Cognition and  memory normal.        Judgment: Judgment is impulsive and inappropriate.   Review of Systems  Psychiatric/Behavioral:  Positive for depression and suicidal ideas. The patient is nervous/anxious and has insomnia.   All other systems reviewed and are negative. Blood pressure 116/76, pulse 75, temperature 98.9 F (37.2 C),  temperature source Oral, resp. rate 14, SpO2 94 %. There is no height or weight on file to calculate BMI.  Treatment Plan Summary: Medication management and Plan The patient will be admitted to a psychiatric  inpatient unit. The patient is COVID positive  Disposition: Recommend psychiatric Inpatient admission when medically cleared. Supportive therapy provided about ongoing stressors.  Gillermo Murdoch, NP 12/30/2020 4:01 AM

## 2020-12-30 NOTE — Progress Notes (Signed)
Inpatient Diabetes Program Recommendations  AACE/ADA: New Consensus Statement on Inpatient Glycemic Control   Target Ranges:  Prepandial:   less than 140 mg/dL      Peak postprandial:   less than 180 mg/dL (1-2 hours)      Critically ill patients:  140 - 180 mg/dL   Results for Amy Juarez, Amy Juarez (MRN [id number]) as of 12/30/2020 09:35  Ref. Range 12/29/2020 22:25 12/30/2020 08:20  Glucose-Capillary Latest Ref Range: 70 - 99 mg/dL 326 (H) 712 (H)    Review of Glycemic Control  Diabetes history: DM2 Outpatient Diabetes medications: Levemir 55 units BID, Invokana 300 daily, Metformin 1000 mg BID, Victoza (no dose or frequency on home med list) Current orders for Inpatient glycemic control: None  Inpatient Diabetes Program Recommendations:    Insulin: While in ED, please consider ordering CBGs AC&HS with Novolog 0-15 units TID with meals and Novolog 0-5 units QHS.  Thanks, Orlando Penner, RN, MSN, CDE Diabetes Coordinator Inpatient Diabetes Program [phone] (Team Pager from 8am to 5pm)

## 2020-12-30 NOTE — ED Notes (Signed)
Poison control notified, Case # [id number],  Recommendation: monitor for respi depression, and mentation to normal. No narcan , No flumazenil if benzo dependent; as will induce seizures

## 2020-12-30 NOTE — ED Notes (Signed)
Hospital  breakfast meal provided ; pt tolerated w/o complaints.  Waste discarded appropriately.  

## 2020-12-30 NOTE — ED Notes (Signed)
Hospital meal provided.  100% consumed, pt tolerated w/o complaints.  Waste discarded appropriately.   

## 2020-12-30 NOTE — ED Notes (Signed)
Pt a/o x3, staff present with interpreter for psych assessment.  Pt verbalizes understanding of admission and currently has no concerns to be addressed.  Cont to monitor as ordered

## 2020-12-30 NOTE — ED Notes (Signed)
IVC/  PENDING  PLACEMENT 

## 2020-12-30 NOTE — ED Notes (Signed)
Communication with patient has been completed using ipad with stratus spanish interpretors x2 tonight after patient woke up. Pt provided with water at this time.

## 2020-12-30 NOTE — ED Notes (Signed)
Message sent to Dr. Sidney Ace due to patient not having any home medications ordered at this time and request for orders.

## 2020-12-30 NOTE — BH Assessment (Signed)
Comprehensive Clinical Assessment (CCA) Note  12/30/2020 Amy Juarez [id number]  Chief Complaint: Patient is a 40 year old female presenting to [facility] ED brought in by her family. Per triage note Pt brought into ED by family due to medication reaction post ingestion of Clonazepam pills this AM. Writer witnessed pt go limp while family trying to get pt into wheelchair. Pt snoring and needing sternal rub to wake. Pt reactive to painful stimuli. Unknown amount of drug taken. Ammonia stimulant used with positive reaction but pt not waking enough to communicate. Per family, pt found medication bottle and ingested all of what was left in the bottle in which was approx 1/2 full. Unknown what time ingested. During assessment interpreter was utilized. Patient appears alert and oriented x4, calm and cooperative, mood appears depressed. Patient reports "I was very sleep, tired and sad, I took 20 pills, I was suffering from insomnia so I purchased them from [place]." When asked if patient wanted to end her life, patient reports that she did "because I suffer from depression and anxiety and my daughter passed away 5 years ago, she was only 41 year old she was diagnosed with Hep B." Patient reports that she currently lives with her husband her son but reports that her family is not that supportive of her mental health. Patient reports that she has a therapist "sometimes I go but sometimes I don't go." Patient continues to report SI, she denies HI/AH/VH and does not appear to be responding to any internal or external stimuli.  Per Psyc NP Elenore Paddy patient is recommended for Inpatient Hospitalization  Chief Complaint  Patient presents with   Drug Overdose   Visit Diagnosis: Major Depressive Disorder, recurrent episode, severe    CCA Screening, Triage and Referral (STR)  Patient Reported Information How did you hear about [place]? Family/Friend  Referral name: No data recorded Referral phone number: No  data recorded  Whom do you see for routine medical problems? No data recorded Practice/Facility Name: No data recorded Practice/Facility Phone Number: No data recorded Name of Contact: No data recorded Contact Number: No data recorded Contact Fax Number: No data recorded Prescriber Name: No data recorded Prescriber Address (if known): No data recorded  What Is the Reason for Your Visit/Call Today? Patient presents via family for intended overdose  How Long Has This Been Causing You Problems? > than 6 months  What Do You Feel Would Help You the Most Today? Treatment for Depression or other mood problem   Have You Recently Been in Any Inpatient Treatment (Hospital/Detox/Crisis Center/28-Day Program)? No data recorded Name/Location of Program/Hospital:No data recorded How Long Were You There? No data recorded When Were You Discharged? No data recorded  Have You Ever Received Services From [facility] Before? No data recorded Who Do You See at [facility]? No data recorded  Have You Recently Had Any Thoughts About Hurting Yourself? Yes  Are You Planning to Commit Suicide/Harm Yourself At This time? Yes   Have you Recently Had Thoughts About Hurting Someone Amy Juarez? No  Explanation: No data recorded  Have You Used Any Alcohol or Drugs in the Past 24 Hours? No  How Long Ago Did You Use Drugs or Alcohol? No data recorded What Did You Use and How Much? No data recorded  Do You Currently Have a Therapist/Psychiatrist? No  Name of Therapist/Psychiatrist: No data recorded  Have You Been Recently Discharged From Any Office Practice or Programs? No  Explanation of Discharge From Practice/Program: No data recorded  CCA Screening Triage Referral Assessment Type of Contact: Face-to-Face  Is this Initial or Reassessment? No data recorded Date Telepsych consult ordered in [facility]:  No data recorded Time Telepsych consult ordered in [facility]:  No data recorded  Patient Reported Information  Reviewed? No data recorded Patient Left Without Being Seen? No data recorded Reason for Not Completing Assessment: No data recorded  Collateral Involvement: No data recorded  Does Patient Have a Court Appointed Legal Guardian? No data recorded Name and Contact of Legal Guardian: No data recorded If Minor and Not Living with Parent(s), Who has Custody? No data recorded Is CPS involved or ever been involved? Never  Is APS involved or ever been involved? Never   Patient Determined To Be At Risk for Harm To Self or Others Based on Review of Patient Reported Information or Presenting Complaint? Yes, for Self-Harm  Method: No data recorded Availability of Means: No data recorded Intent: No data recorded Notification Required: No data recorded Additional Information for Danger to Others Potential: No data recorded Additional Comments for Danger to Others Potential: No data recorded Are There Guns or Other Weapons in Your Home? No data recorded Types of Guns/Weapons: No data recorded Are These Weapons Safely Secured?                            No data recorded Who Could Verify You Are Able To Have These Secured: No data recorded Do You Have any Outstanding Charges, Pending Court Dates, Parole/Probation? No data recorded Contacted To Inform of Risk of Harm To Self or Others: No data recorded  Location of Assessment: [facility] ED   Does Patient Present under Involuntary Commitment? Yes  IVC Papers Initial File Date: 12/29/20   [place] of Residence: [place]   Patient Currently Receiving the Following Services: No data recorded  Determination of Need: Emergent (2 hours)   Options For Referral: No data recorded    CCA Biopsychosocial Intake/Chief Complaint:  No data recorded Current Symptoms/Problems: No data recorded  Patient Reported Schizophrenia/Schizoaffective Diagnosis in Past: No   Strengths: Patient is able to communicate  Preferences: No data recorded Abilities: No  data recorded  Type of Services Patient Feels are Needed: No data recorded  Initial Clinical Notes/Concerns: No data recorded  Mental Health Symptoms Depression:   Change in energy/activity; Difficulty Concentrating; Fatigue; Hopelessness; Tearfulness; Worthlessness   Duration of Depressive symptoms:  Greater than two weeks   Mania:   None   Anxiety:    Difficulty concentrating; Fatigue; Restlessness; Sleep   Psychosis:   None   Duration of Psychotic symptoms: No data recorded  Trauma:   None   Obsessions:   None   Compulsions:   None   Inattention:   None   Hyperactivity/Impulsivity:   None   Oppositional/Defiant Behaviors:   None   Emotional Irregularity:   None   Other Mood/Personality Symptoms:  No data recorded   Mental Status Exam Appearance and self-care  Stature:   Average   Weight:   Overweight   Clothing:   Casual   Grooming:   Normal   Cosmetic use:   None   Posture/gait:   Slumped   Motor activity:   Not Remarkable   Sensorium  Attention:   Normal   Concentration:   Normal   Orientation:   X5   Recall/memory:   Normal   Affect and Mood  Affect:   Appropriate   Mood:   Depressed  Relating  Eye contact:   Normal   Facial expression:   Depressed   Attitude toward examiner:   Cooperative   Thought and Language  Speech flow:  Clear and Coherent   Thought content:   Appropriate to Mood and Circumstances   Preoccupation:   None   Hallucinations:   None   Organization:  No data recorded  [employer] of Knowledge:   Fair   Intelligence:   Average   Abstraction:   Normal   Judgement:   Fair   Dance movement psychotherapist:   Realistic   Insight:   Lacking   Decision Making:   Impulsive   Social Functioning  Social Maturity:   Isolates   Social Judgement:   "Street Smart"   Stress  Stressors:   Grief/losses; Family conflict   Coping Ability:   Exhausted   Skill  Deficits:   None   Supports:   Family     Religion: Religion/Spirituality Are You A Religious Person?: No  Leisure/Recreation: Leisure / Recreation Do You Have Hobbies?: No  Exercise/Diet: Exercise/Diet Do You Exercise?: No Have You Gained or Lost A Significant Amount of Weight in the Past Six Months?: No Do You Follow a Special Diet?: No Do You Have Any Trouble Sleeping?: No   CCA Employment/Education Employment/Work Situation: Employment / Work Situation Employment Situation: Unemployed Has Patient ever Been in Equities trader?: No  Education: Education Is Patient Currently Attending School?: No Did You Have An Individualized Education Program (IIEP): No Did You Have Any Difficulty At [employer]?: No Patient's Education Has Been Impacted by Current Illness: No   CCA Family/Childhood History Family and Relationship History: Family history Marital status: Married What types of issues is patient dealing with in the relationship?: Patient reports that husband is not supportive of her mental health issues "he's says that mommy is just crayz" Additional relationship information: Unknown Does patient have children?: Yes How many children?: 23 (75 year old daughter is deceased) How is patient's relationship with their children?: Patient's 104 year old daughter has been deceased for 5 years due to Hep B  Childhood History:  Childhood History Did patient suffer any verbal/emotional/physical/sexual abuse as a child?: No Did patient suffer from severe childhood neglect?: No Has patient ever been sexually abused/assaulted/raped as an adolescent or adult?: No Was the patient ever a victim of a crime or a disaster?: No Witnessed domestic violence?: No Has patient been affected by domestic violence as an adult?: No  Child/Adolescent Assessment:     CCA Substance Use Alcohol/Drug Use: Alcohol / Drug Use Pain Medications: See MAR Prescriptions: See MAR Over the Counter: See  MAR History of alcohol / drug use?: No history of alcohol / drug abuse                         ASAM's:  Six Dimensions of Multidimensional Assessment  Dimension 1:  Acute Intoxication and/or Withdrawal Potential:      Dimension 2:  Biomedical Conditions and Complications:      Dimension 3:  Emotional, Behavioral, or Cognitive Conditions and Complications:     Dimension 4:  Readiness to Change:     Dimension 5:  Relapse, Continued use, or Continued Problem Potential:     Dimension 6:  Recovery/Living Environment:     ASAM Severity Score:    ASAM Recommended Level of Treatment:     Substance use Disorder (SUD)    Recommendations for Services/Supports/Treatments:  Inpatient  DSM5  Diagnoses: Patient Active Problem List   Diagnosis Date Noted   MDD (major depressive disorder), recurrent episode, severe (HCC) 12/30/2020   Suicide attempt (HCC) 12/30/2020   Diabetes (HCC) 12/30/2020    Patient Centered Plan: Patient is on the following Treatment Plan(s):  Depression   Referrals to Alternative Service(s): Referred to Alternative Service(s):   Place:   Date:   Time:    Referred to Alternative Service(s):   Place:   Date:   Time:    Referred to Alternative Service(s):   Place:   Date:   Time:    Referred to Alternative Service(s):   Place:   Date:   Time:     Karnisha Lefebre A Delana Manganello, LCAS-A

## 2020-12-30 NOTE — ED Notes (Signed)
Hospital lunch meal provided.  100% consumed, pt tolerated w/o complaints.  Waste discarded appropriately.  

## 2020-12-30 NOTE — ED Notes (Signed)
Report received from Annie, RN. Patient currently sleeping, respirations regular and unlabored. Q15 minute rounds and observation by Rover and Officer to continue. Will assess patient once awake.  °

## 2020-12-31 DIAGNOSIS — F332 Major depressive disorder, recurrent severe without psychotic features: Secondary | ICD-10-CM | POA: Diagnosis not present

## 2020-12-31 DIAGNOSIS — T1491XA Suicide attempt, initial encounter: Secondary | ICD-10-CM

## 2020-12-31 LAB — HEMOGLOBIN A1C
Hgb A1c MFr Bld: 10.8 % — ABNORMAL HIGH (ref 4.8–5.6)
Mean Plasma Glucose: 263 mg/dL

## 2020-12-31 LAB — CBG MONITORING, ED
Glucose-Capillary: 376 mg/dL — ABNORMAL HIGH (ref 70–99)
Glucose-Capillary: 285 mg/dL — ABNORMAL HIGH (ref 70–99)
Glucose-Capillary: 258 mg/dL — ABNORMAL HIGH (ref 70–99)
Glucose-Capillary: 368 mg/dL — ABNORMAL HIGH (ref 70–99)

## 2020-12-31 LAB — BASIC METABOLIC PANEL
Anion gap: 9 (ref 5–15)
BUN: 14 mg/dL (ref 6–20)
CO2: 23 mmol/L (ref 22–32)
Calcium: 9.1 mg/dL (ref 8.9–10.3)
Chloride: 102 mmol/L (ref 98–111)
Creatinine, Ser: 0.56 mg/dL (ref 0.44–1.00)
GFR, Estimated: >60 mL/min (ref >60)
Glucose, Bld: 260 mg/dL — ABNORMAL HIGH (ref 70–99)
Potassium: 3.7 mmol/L (ref 3.5–5.1)
Sodium: 134 mmol/L — ABNORMAL LOW (ref 135–145)

## 2020-12-31 MED ORDER — ACETAMINOPHEN 325 MG PO TABS
650.0000 mg | ORAL_TABLET | Freq: Once | ORAL | Status: AC
  Administered 2020-12-31: 650 mg via ORAL

## 2020-12-31 MED ORDER — ONDANSETRON 4 MG PO TBDP
4.0000 mg | ORAL_TABLET | Freq: Once | ORAL | Status: AC
  Administered 2020-12-31: 4 mg via ORAL
  Filled 2020-12-31: qty 1

## 2020-12-31 MED ORDER — INSULIN DETEMIR 100 UNIT/ML ~~LOC~~ SOLN
15.0000 [IU] | Freq: Two times a day (BID) | SUBCUTANEOUS | Status: DC
  Administered 2020-12-31 – 2021-01-03 (×7): 15 [IU] via SUBCUTANEOUS
  Filled 2020-12-31 (×9): qty 0.15

## 2020-12-31 NOTE — ED Notes (Signed)
Pt. Was provided with a snack, sandwich tray and a drink.

## 2020-12-31 NOTE — ED Notes (Signed)
IVC, pending placement 

## 2020-12-31 NOTE — ED Notes (Signed)
NP Barthold at bedside speaking with patient

## 2020-12-31 NOTE — Progress Notes (Signed)
Inpatient Diabetes Program Recommendations  AACE/ADA: New Consensus Statement on Inpatient Glycemic Control (2015)  Target Ranges:  Prepandial:   less than 140 mg/dL      Peak postprandial:   less than 180 mg/dL (1-2 hours)      Critically ill patients:  140 - 180 mg/dL   Results for Amy Juarez, Amy Juarez (MRN [id number]) as of 12/31/2020 11:29  Ref. Range 12/30/2020 08:20 12/30/2020 11:39 12/30/2020 16:06 12/30/2020 21:26 12/31/2020 10:43  Glucose-Capillary Latest Ref Range: 70 - 99 mg/dL 354 (H) 562 (H) 563 (H) 274 (H) 376 (H)   Review of Glycemic Control  Diabetes history: DM2 Outpatient Diabetes medications: Levemir 55 units BID, Invokana 300 daily, Metformin 1000 mg BID, Victoza (no dose or frequency on home med list) Current orders for Inpatient glycemic control: Novolog 0-15 units TID with meals, Novolog 0-5 units QHS  Inpatient Diabetes Program Recommendations:    Insulin: Please consider ordering Levemir 15 units BID.  Thanks, Orlando Penner, RN, MSN, CDE Diabetes Coordinator Inpatient Diabetes Program [phone] (Team Pager from 8am to 5pm)

## 2020-12-31 NOTE — ED Notes (Signed)
MD Roxan Hockey made aware of patients position in bed/floor. MD Roxan Hockey came to patients room to examine her. Patient now experiencing N/V. Zofran and BMP ordered.
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
## 2020-12-31 NOTE — ED Notes (Signed)
Pt. Got dinner tray and drink. 

## 2020-12-31 NOTE — ED Provider Notes (Signed)
Emergency Medicine Observation Re-evaluation Note  Amy Juarez is a 40 y.o. female, seen on rounds today.  Pt initially presented to the ED for complaints of Drug Overdose Currently, the patient is sleeping.  Physical Exam  BP 118/80 (BP Location: Left Arm)   Pulse 88   Temp 98.7 F (37.1 C) (Oral)   Resp 18   SpO2 99%  Physical Exam Gen:  No acute distress Resp:  Breathing easily and comfortably, no accessory muscle usage Neuro:  Moving all four extremities, no gross focal neuro deficits Psych:  Resting currently, calm when awake  ED Course / MDM  EKG:   I have reviewed the labs performed to date as well as medications administered while in observation.  Recent changes in the last 24 hours include no acute events.  Plan  Current plan is for psychiatric placement once cleared from her positive COVID-19 result. Amy Juarez is under involuntary commitment.      Loleta Rose, MD 12/31/20 [phone]

## 2020-12-31 NOTE — ED Notes (Signed)
Pt asleep at this time, unable to collect vitals. Will collect pt vitals once awake. 

## 2020-12-31 NOTE — ED Notes (Signed)
Pt. Is currently taking a shower. Shower supplies was provided for pt.

## 2020-12-31 NOTE — ED Notes (Signed)
This tech was doing 15 min rounding check and taking dinner trays to pt. Upon entering pt room, this tech noticed that pt was laying on side of bed, facing front (stomach down) . Pt. Was advised to lay correctly in bed but needed assistance in getting right into the bed. This tech with security officer helped her getting right into bed.

## 2020-12-31 NOTE — ED Notes (Signed)
[facility] pending placment

## 2020-12-31 NOTE — ED Notes (Signed)
Husband visiting with patient

## 2020-12-31 NOTE — ED Notes (Signed)
Given lunch tray.

## 2021-01-01 DIAGNOSIS — F332 Major depressive disorder, recurrent severe without psychotic features: Secondary | ICD-10-CM | POA: Diagnosis not present

## 2021-01-01 LAB — CBG MONITORING, ED
Glucose-Capillary: 303 mg/dL — ABNORMAL HIGH (ref 70–99)
Glucose-Capillary: 309 mg/dL — ABNORMAL HIGH (ref 70–99)
Glucose-Capillary: 271 mg/dL — ABNORMAL HIGH (ref 70–99)
Glucose-Capillary: 352 mg/dL — ABNORMAL HIGH (ref 70–99)

## 2021-01-01 MED ORDER — FLUCONAZOLE 50 MG PO TABS
150.0000 mg | ORAL_TABLET | Freq: Once | ORAL | Status: AC
  Administered 2021-01-01: 150 mg via ORAL
  Filled 2021-01-01: qty 1

## 2021-01-01 MED ORDER — ACETAMINOPHEN 500 MG PO TABS
1000.0000 mg | ORAL_TABLET | Freq: Once | ORAL | Status: AC
  Administered 2021-01-01: 1000 mg via ORAL

## 2021-01-01 MED ORDER — ACETAMINOPHEN 500 MG PO TABS
ORAL_TABLET | ORAL | Status: AC
  Filled 2021-01-01: qty 2

## 2021-01-01 NOTE — ED Notes (Signed)
[facility] pending placement 

## 2021-01-01 NOTE — Consult Note (Signed)
[facility] Face-to-Face Psychiatry Consult   Reason for Consult: Re-evaluation for Drug Overdose  Referring Physician:  EDP Patient Identification: Amy Juarez MRN:  [id number] Principal Diagnosis: Major depressive disorder, recurrent severe without psychotic features (HCC) Diagnosis:  Principal Problem:   Major depressive disorder, recurrent severe without psychotic features (HCC) Active Problems:   Suicide attempt (HCC)   Diabetes (HCC)   Total Time spent with patient: 20 minutes  Subjective:  "It's hard this time of the year,"  her baby died on 2012-01-11.  Amy Juarez is a 40 y.o. female patient admitted with intentional ingestion of unknown medication, possibly clonazepam  HPI:  Patient seen with interpreter for follow up. Patient remains in the ED, quarantined d/t positive Covid test that is now negative.  Depression continues to be high with endorsement of feelings of worthlessness and hopelessness.  Her depression increases this time of year related to the anniversary of the death of her daughter.  It reached the point where she obtained Klonopin from a friend and overdosed to end her life.  No other suicide attempts.  Patient denies homicidal ideation, auditory or visual hallucinations. Transferring to [facility] BMU today for stabilization.  Past Psychiatric History: Anxiety and depression  Risk to Self:  yes Risk to Others:  none Prior Inpatient Therapy:  none Prior Outpatient Therapy:  none  Past Medical History:  Past Medical History:  Diagnosis Date   Anxiety    Depression    Diabetes mellitus without complication (HCC)    No past surgical history on file. Family History: No family history on file. Family Psychiatric  History: see previous Social History:  Social History   Substance and Sexual Activity  Alcohol Use No     Social History   Substance and Sexual Activity  Drug Use Not on file    Social History   Socioeconomic History   Marital  status: Married    Spouse name: Not on file   Number of children: Not on file   Years of education: Not on file   Highest education level: Not on file  Occupational History   Not on file  Tobacco Use   Smoking status: Never   Smokeless tobacco: Never  Substance and Sexual Activity   Alcohol use: No   Drug use: Not on file   Sexual activity: Not on file  Other Topics Concern   Not on file  Social History Narrative   Not on file   Social Determinants of Health   Financial Resource Strain: Not on file  Food Insecurity: Not on file  Transportation Needs: Not on file  Physical Activity: Not on file  Stress: Not on file  Social Connections: Not on file   Additional Social History:    Allergies:  No Known Allergies  Labs:  Results for orders placed or performed during the hospital encounter of 12/29/20 (from the past 48 hour(s))  POC CBG, ED     Status: Abnormal   Collection Time: 12/30/20  4:06 PM  Result Value Ref Range   Glucose-Capillary 326 (H) 70 - 99 mg/dL    Comment: Glucose reference range applies only to samples taken after fasting for at least 8 hours.  POC CBG, ED     Status: Abnormal   Collection Time: 12/30/20  9:26 PM  Result Value Ref Range   Glucose-Capillary 274 (H) 70 - 99 mg/dL    Comment: Glucose reference range applies only to samples taken after fasting for at least 8 hours.  CBG monitoring, ED     Status: Abnormal   Collection Time: 12/31/20 10:43 AM  Result Value Ref Range   Glucose-Capillary 376 (H) 70 - 99 mg/dL    Comment: Glucose reference range applies only to samples taken after fasting for at least 8 hours.  CBG monitoring, ED     Status: Abnormal   Collection Time: 12/31/20  2:06 PM  Result Value Ref Range   Glucose-Capillary 285 (H) 70 - 99 mg/dL    Comment: Glucose reference range applies only to samples taken after fasting for at least 8 hours.  CBG monitoring, ED     Status: Abnormal   Collection Time: 12/31/20  4:36 PM  Result  Value Ref Range   Glucose-Capillary 258 (H) 70 - 99 mg/dL    Comment: Glucose reference range applies only to samples taken after fasting for at least 8 hours.   Comment 1 Notify RN    Comment 2 Document in Chart   Basic metabolic panel     Status: Abnormal   Collection Time: 12/31/20  4:41 PM  Result Value Ref Range   Sodium 134 (L) 135 - 145 mmol/L   Potassium 3.7 3.5 - 5.1 mmol/L   Chloride 102 98 - 111 mmol/L   CO2 23 22 - 32 mmol/L   Glucose, Bld 260 (H) 70 - 99 mg/dL    Comment: Glucose reference range applies only to samples taken after fasting for at least 8 hours.   BUN 14 6 - 20 mg/dL   Creatinine, Ser 7.06 0.44 - 1.00 mg/dL   Calcium 9.1 8.9 - 23.7 mg/dL   GFR, Estimated >62 >83 mL/min    Comment: (NOTE) Calculated using the CKD-EPI Creatinine Equation (2021)    Anion gap 9 5 - 15    Comment: Performed at [facility], [address] [place], [place] [address]  CBG monitoring, ED     Status: Abnormal   Collection Time: 12/31/20  9:56 PM  Result Value Ref Range   Glucose-Capillary 368 (H) 70 - 99 mg/dL    Comment: Glucose reference range applies only to samples taken after fasting for at least 8 hours.  CBG monitoring, ED     Status: Abnormal   Collection Time: 01/01/21  8:31 AM  Result Value Ref Range   Glucose-Capillary 303 (H) 70 - 99 mg/dL    Comment: Glucose reference range applies only to samples taken after fasting for at least 8 hours.  CBG monitoring, ED     Status: Abnormal   Collection Time: 01/01/21 12:17 PM  Result Value Ref Range   Glucose-Capillary 309 (H) 70 - 99 mg/dL    Comment: Glucose reference range applies only to samples taken after fasting for at least 8 hours.    Current Facility-Administered Medications  Medication Dose Route Frequency Provider Last Rate Last Admin   insulin aspart (novoLOG) injection 0-15 Units  0-15 Units Subcutaneous TID WC Gilles Chiquito, MD   11 Units at 01/01/21 1227   insulin aspart (novoLOG) injection  0-5 Units  0-5 Units Subcutaneous QHS Gilles Chiquito, MD   5 Units at 12/31/20 2200   insulin detemir (LEVEMIR) injection 15 Units  15 Units Subcutaneous BID Willy Eddy, MD   15 Units at 01/01/21 1004   Current Outpatient Medications  Medication Sig Dispense Refill   gabapentin (NEURONTIN) 300 MG capsule Take 300 mg by mouth 3 (three) times daily.     atorvastatin (LIPITOR) 40 MG tablet Take 40 mg  by mouth at bedtime.     DIFLUCAN 150 MG tablet Take by mouth.     FLUoxetine HCl 60 MG TABS Take 1 tablet by mouth daily.     hydrOXYzine (ATARAX/VISTARIL) 25 MG tablet Take 25 mg by mouth at bedtime as needed.     ibuprofen (ADVIL) 800 MG tablet Take 800 mg by mouth every 8 (eight) hours as needed. (Patient not taking: No sig reported)     INVOKANA 300 MG TABS tablet Take 300 mg by mouth daily.     LEVEMIR FLEXTOUCH 100 UNIT/ML FlexTouch Pen Inject 55 Units into the skin in the morning and at bedtime.     metFORMIN (GLUCOPHAGE) 1000 MG tablet Take 1,000 mg by mouth 2 (two) times daily.     omeprazole (PRILOSEC) 20 MG capsule Take 1 capsule by mouth daily.     ondansetron (ZOFRAN) 8 MG tablet Take 8 mg by mouth 3 (three) times daily.     tiZANidine (ZANAFLEX) 4 MG tablet Take 4 mg by mouth 2 (two) times daily as needed.     VICTOZA 18 MG/3ML SOPN Inject into the skin.      Musculoskeletal: Strength & Muscle Tone: within normal limits Gait & Station: normal Patient leans: N/A            Psychiatric Specialty Exam:  Presentation  General Appearance: Appropriate for Environment  Eye Contact:Good  Speech:Clear and Coherent  Speech Volume:Normal  Handedness:Right   Mood and Affect  Mood:Depressed  Affect:Appropriate; Congruent; Depressed   Thought Process  Thought Processes:Coherent  Descriptions of Associations:Intact  Orientation:Full (Time, Place and Person)  Thought Content:Logical  History of Schizophrenia/Schizoaffective disorder:No  Duration of  Psychotic Symptoms:  None Hallucinations:Hallucinations: None  Ideas of Reference:None  Suicidal Thoughts:  Yes, overdosed Homicidal Thoughts:Homicidal Thoughts: No   Sensorium  Memory:Immediate Good; Remote Good  Judgment:Poor  Insight:Present   Executive Functions  Concentration:Good  Attention Span:Good  Recall:Good  Fund of Knowledge:Good  Language:Good   Psychomotor Activity  Psychomotor Activity:Psychomotor Activity: Normal   Assets  Assets:Communication Skills; Desire for Improvement   Sleep  Sleep:Sleep: Good   Physical Exam: Physical Exam Vitals and nursing note reviewed.  HENT:     Head: Normocephalic.  Eyes:     General:        Right eye: No discharge.        Left eye: No discharge.  Cardiovascular:     Comments: Patient tearful  Pulmonary:     Effort: Pulmonary effort is normal.  Musculoskeletal:        General: Normal range of motion.     Cervical back: Normal range of motion.  Skin:    General: Skin is dry.  Neurological:     Mental Status: She is alert and oriented to person, place, and time.  Psychiatric:        Attention and Perception: Attention normal.        Mood and Affect: Mood is anxious and depressed.        Speech: Speech normal.        Behavior: Behavior normal. Behavior is cooperative.        Thought Content: Thought content is not paranoid or delusional. Thought content does not include homicidal or suicidal ideation.        Cognition and Memory: Cognition normal.        Judgment: Judgment is impulsive.   Review of Systems  Psychiatric/Behavioral:  Positive for depression. Negative for hallucinations, memory loss, substance abuse and suicidal  ideas (Not at this time). The patient is nervous/anxious. The patient does not have insomnia.   Blood pressure 114/78, pulse 89, temperature 98.1 F (36.7 C), temperature source Oral, resp. rate 18, SpO2 100 %. There is no height or weight on file to calculate BMI.  Treatment  Plan Summary: Daily contact with patient to assess and evaluate symptoms and progress in treatment and Medication management.  Major depressive disorder, recurrent, severe without psychosis: -Admit to [facility] BMU for stabilization -Medications not started related to overdose and sleeping most of the time in the ED  Disposition: Recommend psychiatric Inpatient admission when medically cleared. Supportive therapy provided about ongoing stressors.  Nanine Means, NP 01/01/2021 3:25 PM

## 2021-01-01 NOTE — ED Notes (Signed)
Pt lying in bed; calm, cooperative. Pt speaks limited english; [employer] used for interpretation. Pt states that she feels "good". Pt denies pain. Pt endorses SI at this time with plan to take pills; however, she denies HI/AVH. Pt states that she slept "good" last night and describes her appetite as poor. Pt expresses feelings of anxiety (6-7/10) and depression and states "I feel depression and despair because I lost a baby girl 10 years ago on the 29th; she was 5"; pt tearful. Pt also states "they took my baby from me at the hospital and she got bacterial meningitis. She was premature and in an incubator for 5 I/2 months and my husband had an accident around the same time." Pt states that she is on psychiatric medications but they don't work and she has told her psychologist but when she goes for an appointment, she sees a different person each time and has to tell her story all over again.

## 2021-01-01 NOTE — Progress Notes (Signed)
Inpatient Diabetes Program Recommendations  AACE/ADA: New Consensus Statement on Inpatient Glycemic Control (2015)  Target Ranges:  Prepandial:   less than 140 mg/dL      Peak postprandial:   less than 180 mg/dL (1-2 hours)      Critically ill patients:  140 - 180 mg/dL  Results for Amy Juarez, Amy Juarez (MRN [id number]) as of 01/01/2021 09:43  Ref. Range 12/31/2020 10:43 12/31/2020 14:06 12/31/2020 16:36 12/31/2020 21:56  Glucose-Capillary Latest Ref Range: 70 - 99 mg/dL 518 (H)  15 units Novolog  285 (H)     15 units Levemir 258 (H)  8 units Novolog  368 (H)  5 units Novolog  15 units Levemir  Results for [name], [name] (MRN [id number]) as of 01/01/2021 09:43  Ref. Range 01/01/2021 08:31  Glucose-Capillary Latest Ref Range: 70 - 99 mg/dL 160 (H)  11 units Novolog      Home DM Meds: Levemir 55 units BID      Invokana 300 daily      Metformin 1000 mg BID      Victoza (no dose or frequency on home med list)   Current Orders: Levemir 15 units BID      Novolog Moderate Correction Scale/ SSI (0-15 units) TID AC + HS    MD- Note Levemir started yesterday AM.  CBG 303 this AM.  Please consider:  1. Increase Levemir to 20 units BID  2. Start Novolog Meal Coverage: Novolog 6 units TID with meals (Give with Novolog SSi dose)  Hold if pt eats <50% of meal, Hold if pt NPO    --Will follow patient during hospitalization--  Ambrose Finland RN, MSN, CDE Diabetes Coordinator Inpatient Glycemic Control Team Team Pager: [phone] (8a-5p)

## 2021-01-01 NOTE — ED Notes (Signed)
Pt given a turkey sandwich tray and a cup of ginger ale.  

## 2021-01-01 NOTE — ED Provider Notes (Signed)
Emergency Medicine Observation Re-evaluation Note  Amy Juarez is a 40 y.o. female, seen on rounds today.  Pt initially presented to the ED for complaints of Drug Overdose Currently, the patient is calm with no complaints.  Physical Exam  BP (!) 151/91 (BP Location: Right Arm)   Pulse (!) 107   Temp 98.9 F (37.2 C) (Oral)   Resp 20   SpO2 100%  Physical Exam   General: No apparent distress HEENT: moist mucous membranes CV: RRR Pulm: Normal WOB GI: soft and non tender MSK: no edema or cyanosis Neuro: face symmetric, moving all extremities  ED Course / MDM  EKG:   I have reviewed the labs performed to date as well as medications administered while in observation.  No acute changes overnight or new labs this morning Plan  Current plan is for placement. Amy Juarez is under involuntary commitment.      Don Perking, [place], MD 01/01/21 [phone]

## 2021-01-02 LAB — CBG MONITORING, ED
Glucose-Capillary: 303 mg/dL — ABNORMAL HIGH (ref 70–99)
Glucose-Capillary: 298 mg/dL — ABNORMAL HIGH (ref 70–99)
Glucose-Capillary: 197 mg/dL — ABNORMAL HIGH (ref 70–99)
Glucose-Capillary: 279 mg/dL — ABNORMAL HIGH (ref 70–99)

## 2021-01-02 NOTE — ED Notes (Addendum)
Hospital meal provided.  Pt declined.

## 2021-01-02 NOTE — ED Notes (Addendum)
This rn cleaned room, door, mopped floor, cleansed baseboards and gargarge door, walls and head of bed. Trash removed from room, dirty linens removed from room.

## 2021-01-02 NOTE — ED Provider Notes (Signed)
Emergency Medicine Observation Re-evaluation Note  Amy Juarez is a 40 y.o. female, seen on rounds today.  Pt initially presented to the ED for complaints of Drug Overdose Currently, the patient is resting, voices no medical complaints.  Physical Exam  BP 122/85 (BP Location: Right Arm)   Pulse 87   Temp 98.5 F (36.9 C) (Oral)   Resp 17   SpO2 100%  Physical Exam General: Resting in no acute distress Cardiac: No cyanosis Lungs: Equal rise and fall Psych: Not agitated  ED Course / MDM  EKG:   I have reviewed the labs performed to date as well as medications administered while in observation.  Recent changes in the last 24 hours include no events overnight.  Plan  Current plan is for psychiatric disposition. Amy Juarez is under involuntary commitment.      Irean Hong, MD 01/02/21 [phone]

## 2021-01-02 NOTE — ED Notes (Signed)
[facility] pending placement 

## 2021-01-02 NOTE — ED Provider Notes (Signed)
Emergency Medicine Observation Re-evaluation Note  Amy Juarez is a 40 y.o. female, seen on rounds today.  Patient was initially seen in the emergency department for a overdose.  Patient is calm cooperative no acute complaints. Physical Exam   Vitals:   01/02/21 1735 01/02/21 2134  BP: 118/78 (!) 138/93  Pulse: 80 84  Resp: 17 18  Temp: 98.8 F (37.1 C) 98.7 F (37.1 C)  SpO2: 98% 97%    General: Resting in no acute distress Cardiac: No cyanosis Lungs: Equal rise and fall Psych: Not agitated   ED Course / MDM   Patient's blood glucoses have been between 150 and 300 for the most part today.  No other acute lab work for review.   Plan  Current plan is for psychiatric disposition. Amy Juarez is under involuntary commitment.   Minna Antis, MD 01/02/21 2232

## 2021-01-02 NOTE — ED Notes (Signed)
Spanish interpreter services utilized to speak with pt. Pt expresses concerns regarding cleanliness of room. Pt states her sister would like her removed due to room not being cleansed. Pt informed this rn that staff told her her room could not be cleaned until she "is out of it". Pt informed this RN will get room sanitized tonight. Pt verbalizes understanding. Pt denies Si currently.

## 2021-01-02 NOTE — ED Notes (Signed)
Pt resting in bed and watching tv.

## 2021-01-02 NOTE — ED Notes (Signed)
Pt up to restroom.

## 2021-01-02 NOTE — ED Notes (Signed)
Hospital meal provided.  100% consumed, pt tolerated w/o complaints.  Waste discarded appropriately.   

## 2021-01-02 NOTE — ED Notes (Signed)
Pt in bathroom/shower.  Preforming ADL's w/o assistance.  Clean clothing and linens provided.

## 2021-01-02 NOTE — ED Notes (Signed)
Per shan broadnax, fsbs 267, waiting for meter to dock.

## 2021-01-03 ENCOUNTER — Other Ambulatory Visit: Payer: Self-pay

## 2021-01-03 ENCOUNTER — Inpatient Hospital Stay
Admission: RE | Admit: 2021-01-03 | Discharge: 2021-01-06 | DRG: 885 | Disposition: A | Discharge status: Home / Self Care | Payer: 59 | Source: Intra-hospital | Attending: Behavioral Health | Admitting: Behavioral Health

## 2021-01-03 ENCOUNTER — Encounter: Payer: Self-pay | Admitting: Psychiatry

## 2021-01-03 DIAGNOSIS — Z7984 Long term (current) use of oral hypoglycemic drugs: Secondary | ICD-10-CM

## 2021-01-03 DIAGNOSIS — E119 Type 2 diabetes mellitus without complications: Secondary | ICD-10-CM | POA: Diagnosis present

## 2021-01-03 DIAGNOSIS — K219 Gastro-esophageal reflux disease without esophagitis: Secondary | ICD-10-CM | POA: Diagnosis present

## 2021-01-03 DIAGNOSIS — F332 Major depressive disorder, recurrent severe without psychotic features: Secondary | ICD-10-CM | POA: Diagnosis present

## 2021-01-03 DIAGNOSIS — E785 Hyperlipidemia, unspecified: Secondary | ICD-10-CM | POA: Diagnosis present

## 2021-01-03 DIAGNOSIS — T50902A Poisoning by unspecified drugs, medicaments and biological substances, intentional self-harm, initial encounter: Secondary | ICD-10-CM | POA: Diagnosis present

## 2021-01-03 DIAGNOSIS — Z79899 Other long term (current) drug therapy: Secondary | ICD-10-CM | POA: Diagnosis not present

## 2021-01-03 DIAGNOSIS — Z794 Long term (current) use of insulin: Secondary | ICD-10-CM | POA: Diagnosis not present

## 2021-01-03 DIAGNOSIS — T1491XA Suicide attempt, initial encounter: Secondary | ICD-10-CM | POA: Diagnosis present

## 2021-01-03 LAB — RESP PANEL BY RT-PCR (FLU A&B, COVID) ARPGX2
Influenza A by PCR: NEGATIVE
Influenza B by PCR: NEGATIVE
SARS Coronavirus 2 by RT PCR: NEGATIVE

## 2021-01-03 LAB — CBG MONITORING, ED
Glucose-Capillary: 258 mg/dL — ABNORMAL HIGH (ref 70–99)
Glucose-Capillary: 337 mg/dL — ABNORMAL HIGH (ref 70–99)

## 2021-01-03 LAB — GLUCOSE, CAPILLARY
Glucose-Capillary: 422 mg/dL — ABNORMAL HIGH (ref 70–99)
Glucose-Capillary: 261 mg/dL — ABNORMAL HIGH (ref 70–99)

## 2021-01-03 MED ORDER — FLUOXETINE HCL 20 MG PO CAPS
20.0000 mg | ORAL_CAPSULE | Freq: Every day | ORAL | Status: DC
  Administered 2021-01-04: 20 mg via ORAL
  Filled 2021-01-03: qty 1

## 2021-01-03 MED ORDER — FLUOXETINE HCL 20 MG PO CAPS
20.0000 mg | ORAL_CAPSULE | Freq: Every day | ORAL | Status: DC
  Administered 2021-01-03: 20 mg via ORAL
  Filled 2021-01-03: qty 1

## 2021-01-03 MED ORDER — ATORVASTATIN CALCIUM 20 MG PO TABS
40.0000 mg | ORAL_TABLET | Freq: Every day | ORAL | Status: DC
  Administered 2021-01-04 – 2021-01-05 (×2): 40 mg via ORAL
  Filled 2021-01-03 (×2): qty 2

## 2021-01-03 MED ORDER — INSULIN DETEMIR 100 UNIT/ML ~~LOC~~ SOLN
15.0000 [IU] | Freq: Two times a day (BID) | SUBCUTANEOUS | Status: DC
  Administered 2021-01-03 – 2021-01-04 (×2): 15 [IU] via SUBCUTANEOUS
  Filled 2021-01-03 (×3): qty 0.15

## 2021-01-03 MED ORDER — INSULIN ASPART 100 UNIT/ML IJ SOLN
0.0000 [IU] | Freq: Every day | INTRAMUSCULAR | Status: DC
  Administered 2021-01-03: 3 [IU] via SUBCUTANEOUS
  Administered 2021-01-04 – 2021-01-05 (×2): 2 [IU] via SUBCUTANEOUS
  Filled 2021-01-03 (×2): qty 1

## 2021-01-03 MED ORDER — ACETAMINOPHEN 325 MG PO TABS
650.0000 mg | ORAL_TABLET | Freq: Four times a day (QID) | ORAL | Status: DC | PRN
  Administered 2021-01-03 – 2021-01-05 (×3): 650 mg via ORAL
  Filled 2021-01-03 (×3): qty 2

## 2021-01-03 MED ORDER — INSULIN ASPART 100 UNIT/ML IJ SOLN
0.0000 [IU] | Freq: Three times a day (TID) | INTRAMUSCULAR | Status: DC
  Administered 2021-01-04: 8 [IU] via SUBCUTANEOUS
  Administered 2021-01-04: 5 [IU] via SUBCUTANEOUS
  Administered 2021-01-04: 8 [IU] via SUBCUTANEOUS
  Administered 2021-01-05: 15 [IU] via SUBCUTANEOUS
  Administered 2021-01-05: 3 [IU] via SUBCUTANEOUS
  Administered 2021-01-05: 5 [IU] via SUBCUTANEOUS
  Administered 2021-01-06: 11 [IU] via SUBCUTANEOUS
  Administered 2021-01-06: 5 [IU] via SUBCUTANEOUS
  Filled 2021-01-03 (×8): qty 1

## 2021-01-03 MED ORDER — PANTOPRAZOLE SODIUM 40 MG PO TBEC
80.0000 mg | DELAYED_RELEASE_TABLET | Freq: Every day | ORAL | Status: DC
  Administered 2021-01-04 – 2021-01-06 (×3): 80 mg via ORAL
  Filled 2021-01-03 (×3): qty 2

## 2021-01-03 MED ORDER — ALUM & MAG HYDROXIDE-SIMETH 200-200-20 MG/5ML PO SUSP
30.0000 mL | ORAL | Status: DC | PRN
  Administered 2021-01-05: 30 mL via ORAL
  Filled 2021-01-03: qty 30

## 2021-01-03 MED ORDER — INSULIN ASPART 100 UNIT/ML IJ SOLN
18.0000 [IU] | Freq: Once | INTRAMUSCULAR | Status: AC
  Administered 2021-01-03: 18 [IU] via SUBCUTANEOUS
  Filled 2021-01-03: qty 1

## 2021-01-03 MED ORDER — MAGNESIUM HYDROXIDE 400 MG/5ML PO SUSP: 30.0000 mL | Freq: Every day | ORAL | Status: DC | PRN

## 2021-01-03 MED ORDER — GABAPENTIN 300 MG PO CAPS
300.0000 mg | ORAL_CAPSULE | Freq: Three times a day (TID) | ORAL | Status: DC
  Administered 2021-01-03 – 2021-01-06 (×9): 300 mg via ORAL
  Filled 2021-01-03 (×9): qty 1

## 2021-01-03 MED ORDER — LIVING WELL WITH DIABETES BOOK - IN SPANISH
Freq: Once | Status: AC
  Filled 2021-01-03: qty 1

## 2021-01-03 NOTE — ED Notes (Signed)
Report called to JOY, RN in behavioral health unit.  Will transport when staff available

## 2021-01-03 NOTE — ED Notes (Signed)
Report to katie, rn 

## 2021-01-03 NOTE — BH Assessment (Signed)
Writer left message on voicemail to return call on [phone] Lennox Grumbles).

## 2021-01-03 NOTE — Progress Notes (Signed)
Awake, alert and oriented x 3. No acute distress noted. Blood glucose reading 261 just prior to snack time. Live interpreter to translate and patient denies SI/HI/AVH. Denies pain. Education provided on hypo/hyperglycemia and patient verbalized understanding. Patient given peanut butter crackers and milk as well as bottle of water for bedtime snack and ate crackers with no complaints. Remains safe on the unit with q15 min safety checks.

## 2021-01-03 NOTE — ED Notes (Signed)
Hospital breakfast meal provided.  100% consumed, pt tolerated w/o complaints.  Waste discarded appropriately.  

## 2021-01-03 NOTE — ED Notes (Signed)
Living with DMII book, spanish addition given to pt.

## 2021-01-03 NOTE — ED Notes (Signed)
Repeat covid test collected w/o incident

## 2021-01-03 NOTE — ED Notes (Signed)
Pt and family notified that Pt has been accepted into the [place] unit located in the lower level.  There is a plan in place to move her downstairs at 2pm.  Pt and family verbalized understanding and denies any additional questions.

## 2021-01-03 NOTE — Progress Notes (Addendum)
Admission Note:   Pt admitted to [facility], BMU adult unit at 14:30pm from Psych ED. Per nurse report from ED, pt is Spanish speaking only, has been in the ED for several days, A&O to person, place, time and situation, came to ED on an IVC, status post overdose, has been depressed since her child died 41 year old ago.   During admission, the Ipad Interpreter app was used to communicate with pt:    Pt is A&Ox4, calm, cooperative, pleasant. Pt's skin assessment was completed with Clinical research associate and Consulting civil engineer as witness. Pt signed admission paperwork. Pt reports that the anniversary of her child's death is coming up on 01-15-2021 which has been triggering her recent increased feelings of depression, SI, and the OD attempt. Pt reports a history of previous attempt of an overdose by injecting herself with her insulin around the time her child died 5 years ago. Pt's reports  this time, she took several pills of unknown type that she obtained from a neighbor, but thinks they might be in the benzo family, (Xanax or Klonopin), that were obtained from [place] by her neighbor. Pt reports she lives in a trailer with her husband, two children and her niece. Pt reports she would have access to a car to get to any outpt appointments after discharge. 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 Pt reports that she feels that she has no one to talk to when she feels depressed. Pt denies SI/HI/AVH at time of admission, reports feelings of anxiety, rates it 5/10 on a 0-10 scale, 10 being worst, and depression 6/10 on a 0-10 scale, 10 being worst. Pt denies pain. Pt denies alcohol use or substance abuse, denies tobacco use. Pt denies any history of verbal, sexual, or physical abuse. Pt reports her medical history as: Insulin dependant diabetes, carpel tunnel syndrome, high cholesterol and was hospitalized in [place], [place], in 2018 for a neurological condition that she cannot remember the name of the  disorder, but reports she was not able to walk at that time. Currently pt is ambulatory, no assistance needed to ambulate, steady gait, denies falls, performs all her ADLs without assist. Pt's vital signs are WNL. Pt denies any fire arms at home. Pt was given orientation to the unit, and to her room, and the unit routines. Pt showered shortly after admission. Will continue to monitor pt per Q15 minute face checks and monitor for safety and progress.   Pt brought in home meds:  6 bottles Filled at [facility] [phone] [address], [place], [place] [address] Written in Spanish on the bottles:  Invonkana 300mg  PO (Tome 1 Tableta Por [place] V[place]).   Tizanidine HCL 4mg  PO (Tome 1 Tablenta Por [place] Veces Al D[place] Necesario Para Dol[place]).   Ondansetrol HCL 8mg  Tablet (Tome 1 Tableta Por [place] 8 Horas Para Nausea).   4. Metformin HCL 1000mg  Tablet (Tome 1 Tableta Por [place] 2 Veces Al [place]).  5. Gabapentin 300mg  Capsule (Tome 1 Capsula Por [place] 3 Veces Al ).   6. Ibuprofen 800mg  Tablet (Tome 1 Tableta Por [place] 8 Horas Si [place] Para Dolor).   These home meds were sent to be stored in inpatient pharmacy.

## 2021-01-03 NOTE — Tx Team (Signed)
Initial Treatment Plan 01/03/2021 4:17 PM Amy Juarez LKT:[id number]    PATIENT STRESSORS: Traumatic event     PATIENT STRENGTHS: Ability for insight  Average or above average intelligence  Capable of independent living  Motivation for treatment/growth    PATIENT IDENTIFIED PROBLEMS: Feelings of Depression, Grief unresolved, Loss of child 5 yrs ago.                     DISCHARGE CRITERIA:  Ability to meet basic life and health needs Adequate post-discharge living arrangements Motivation to continue treatment in a less acute level of care Verbal commitment to aftercare and medication compliance  PRELIMINARY DISCHARGE PLAN: Attend aftercare/continuing care group Outpatient therapy Return to previous living arrangement  PATIENT/FAMILY INVOLVEMENT: This treatment plan has been presented to and reviewed with the patient, Amy Juarez, and/or family member.  The patient and family have been given the opportunity to ask questions and make suggestions.  Ginger Carne, RN 01/03/2021, 4:17 PM

## 2021-01-03 NOTE — BH Assessment (Signed)
Patient is to be admitted to [facility] today at 2:00pm by Psychiatric Nurse Practitioner Nanine Means.  Attending Physician will be Dr.  Toni Amend .   Patient has been assigned to room 306, by [facility] Charge Nurse [place].    ER staff is aware of the admission: Misty Stanley, ER Secretary   Dr. Elige Radon, ER MD  Florentina Addison, Patient's Nurse  Valentina Gu, Patient Access.

## 2021-01-04 DIAGNOSIS — F332 Major depressive disorder, recurrent severe without psychotic features: Principal | ICD-10-CM

## 2021-01-04 LAB — GLUCOSE, CAPILLARY
Glucose-Capillary: 279 mg/dL — ABNORMAL HIGH (ref 70–99)
Glucose-Capillary: 297 mg/dL — ABNORMAL HIGH (ref 70–99)
Glucose-Capillary: 249 mg/dL — ABNORMAL HIGH (ref 70–99)
Glucose-Capillary: 229 mg/dL — ABNORMAL HIGH (ref 70–99)

## 2021-01-04 MED ORDER — INSULIN ASPART 100 UNIT/ML IJ SOLN
6.0000 [IU] | Freq: Three times a day (TID) | INTRAMUSCULAR | Status: DC
  Administered 2021-01-04 – 2021-01-05 (×4): 6 [IU] via SUBCUTANEOUS
  Filled 2021-01-04 (×4): qty 1

## 2021-01-04 MED ORDER — DULOXETINE HCL 30 MG PO CPEP
30.0000 mg | ORAL_CAPSULE | Freq: Every day | ORAL | Status: DC
  Administered 2021-01-04 – 2021-01-06 (×3): 30 mg via ORAL
  Filled 2021-01-04 (×3): qty 1

## 2021-01-04 MED ORDER — INSULIN DETEMIR 100 UNIT/ML ~~LOC~~ SOLN
25.0000 [IU] | Freq: Two times a day (BID) | SUBCUTANEOUS | Status: DC
  Administered 2021-01-04 – 2021-01-06 (×4): 25 [IU] via SUBCUTANEOUS
  Filled 2021-01-04 (×5): qty 0.25

## 2021-01-04 MED ORDER — METFORMIN HCL 500 MG PO TABS
1000.0000 mg | ORAL_TABLET | Freq: Two times a day (BID) | ORAL | Status: DC
  Administered 2021-01-04 – 2021-01-06 (×5): 1000 mg via ORAL
  Filled 2021-01-04 (×5): qty 2

## 2021-01-04 NOTE — Group Note (Signed)
[facility] LCSW Group Therapy Note    Group Date: 01/04/2021 Start Time: 1306 End Time: 1400  Type of Therapy and Topic:  Group Therapy:  Overcoming Obstacles  Participation Level:  [facility] PARTICIPATION LEVEL: Did Not Attend  Mood:  Description of Group:   In this group patients will be encouraged to explore what they see as obstacles to their own wellness and recovery. They will be guided to discuss their thoughts, feelings, and behaviors related to these obstacles. The group will process together ways to cope with barriers, with attention given to specific choices patients can make. Each patient will be challenged to identify changes they are motivated to make in order to overcome their obstacles. This group will be process-oriented, with patients participating in exploration of their own experiences as well as giving and receiving support and challenge from other group members.  Therapeutic Goals: 1. Patient will identify personal and current obstacles as they relate to admission. 2. Patient will identify barriers that currently interfere with their wellness or overcoming obstacles.  3. Patient will identify feelings, thought process and behaviors related to these barriers. 4. Patient will identify two changes they are willing to make to overcome these obstacles:    Summary of Patient Progress X  Therapeutic Modalities:   Cognitive Behavioral Therapy Solution Focused Therapy Motivational Interviewing Relapse Prevention Therapy   Alaya Iverson R Faelyn Sigler, LCSW 

## 2021-01-04 NOTE — Progress Notes (Signed)
Recreation Therapy Notes  INPATIENT RECREATION TR PLAN  Patient Details Name: Amy Juarez MRN: [id number] DOB: 02/28/1981 Today's Date: 01/04/2021  Rec Therapy Plan Is patient appropriate for Therapeutic Recreation?: Yes Treatment times per week: at least 3 Estimated Length of Stay: 5-7 days TR Treatment/Interventions: Group participation (Comment)  Discharge Criteria Pt will be discharged from therapy if:: Discharged Treatment plan/goals/alternatives discussed and agreed upon by:: Patient/family  Discharge Summary     Lorece Keach 01/04/2021, 2:51 PM

## 2021-01-04 NOTE — Progress Notes (Signed)
Recreation Therapy Notes  INPATIENT RECREATION THERAPY ASSESSMENT  Patient Details Name: Amy Juarez MRN: [id number] DOB: 07-21-1980 Today's Date: 01/04/2021       Information Obtained From: Chart Review  Able to Participate in Assessment/Interview:    Patient Presentation:    Reason for Admission (Per Patient): Active Symptoms, Suicidal Ideation, Suicide Attempt  Patient Stressors: Death  Coping Skills:   Prayer  Leisure Interests (2+):  Social - Family, Exercise - Walking (Time with dogs)  Frequency of Recreation/Participation: Monthly  Awareness of Community Resources:  Yes  Community Resources:  Church  Current Use: Yes  If no, Barriers?:    Expressed Interest in [employer] Information: Yes  [place]idence[place]  Patient Main Form of Transportation: Set designer  Patient Strengths:  Working  Patient Identified Areas of Improvement:  Going back to work  Patient Goal for Hospitalization:  Help with depression  Current SI (including self-harm):  No  Current HI:  No  Current AVH: No  Staff Intervention Plan: Group Attendance, Collaborate with Interdisciplinary Treatment Team  Consent to Intern Participation: N/A  Amy Juarez 01/04/2021, 2:51 PM

## 2021-01-04 NOTE — BH IP Treatment Plan (Signed)
Interdisciplinary Treatment and Diagnostic Plan Update  01/04/2021 Time of Session: 9:00AM Yanai Hobson MRN: [id number]  Principal Diagnosis: <principal problem not specified>  Secondary Diagnoses: Active Problems:   Major depressive disorder, recurrent severe without psychotic features ([place])   Current Medications:  Current Facility-Administered Medications  Medication Dose Route Frequency Provider Last Rate Last Admin   acetaminophen (TYLENOL) tablet 650 mg  650 mg Oral Q6H PRN Patrecia Pour, NP   650 mg at 01/04/21 0646   alum & mag hydroxide-simeth (MAALOX/MYLANTA) 200-200-20 MG/5ML suspension 30 mL  30 mL Oral Q4H PRN Patrecia Pour, NP       atorvastatin (LIPITOR) tablet 40 mg  40 mg Oral QHS Patrecia Pour, NP       FLUoxetine (PROZAC) capsule 20 mg  20 mg Oral Daily Patrecia Pour, NP   20 mg at 01/04/21 0805   gabapentin (NEURONTIN) capsule 300 mg  300 mg Oral TID Patrecia Pour, NP   300 mg at 01/04/21 0805   insulin aspart (novoLOG) injection 0-15 Units  0-15 Units Subcutaneous TID WC Patrecia Pour, NP   8 Units at 01/04/21 0804   insulin aspart (novoLOG) injection 0-5 Units  0-5 Units Subcutaneous QHS Patrecia Pour, NP   3 Units at 01/03/21 2037   insulin detemir (LEVEMIR) injection 15 Units  15 Units Subcutaneous BID Patrecia Pour, NP   15 Units at 01/04/21 0734   magnesium hydroxide (MILK OF MAGNESIA) suspension 30 mL  30 mL Oral Daily PRN Patrecia Pour, NP       metFORMIN (GLUCOPHAGE) tablet 1,000 mg  1,000 mg Oral BID WC Salley Scarlet, MD   1,000 mg at 01/04/21 0805   pantoprazole (PROTONIX) EC tablet 80 mg  80 mg Oral Daily Patrecia Pour, NP   80 mg at 01/04/21 0805   PTA Medications: Medications Prior to Admission  Medication Sig Dispense Refill Last Dose   atorvastatin (LIPITOR) 40 MG tablet Take 40 mg by mouth at bedtime.      FLUoxetine HCl 60 MG TABS Take 1 tablet by mouth daily.      gabapentin (NEURONTIN) 300 MG capsule Take 300 mg  by mouth 3 (three) times daily.      hydrOXYzine (ATARAX/VISTARIL) 25 MG tablet Take 25 mg by mouth at bedtime as needed.      ibuprofen (ADVIL) 800 MG tablet Take 800 mg by mouth every 8 (eight) hours as needed. (Patient not taking: No sig reported)      INVOKANA 300 MG TABS tablet Take 300 mg by mouth daily.      LEVEMIR FLEXTOUCH 100 UNIT/ML FlexTouch Pen Inject 55 Units into the skin in the morning and at bedtime.      metFORMIN (GLUCOPHAGE) 1000 MG tablet Take 1,000 mg by mouth 2 (two) times daily.      omeprazole (PRILOSEC) 20 MG capsule Take 1 capsule by mouth daily.      ondansetron (ZOFRAN) 8 MG tablet Take 8 mg by mouth 3 (three) times daily.      tiZANidine (ZANAFLEX) 4 MG tablet Take 4 mg by mouth 2 (two) times daily as needed.      VICTOZA 18 MG/3ML SOPN Inject into the skin.       Patient Stressors: Traumatic event    Patient Strengths: Ability for insight  Average or above average intelligence  Capable of independent living  Motivation for treatment/growth   Treatment Modalities: Medication Management, Group therapy, Case management,  1 to 1 session with clinician, Psychoeducation, Recreational therapy.   Physician Treatment Plan for Primary Diagnosis: <principal problem not specified> Long Term Goal(s):     Short Term Goals:    Medication Management: Evaluate patient's response, side effects, and tolerance of medication regimen.  Therapeutic Interventions: 1 to 1 sessions, Unit Group sessions and Medication administration.  Evaluation of Outcomes: Not Met  Physician Treatment Plan for Secondary Diagnosis: Active Problems:   Major depressive disorder, recurrent severe without psychotic features ([place])  Long Term Goal(s):     Short Term Goals:       Medication Management: Evaluate patient's response, side effects, and tolerance of medication regimen.  Therapeutic Interventions: 1 to 1 sessions, Unit Group sessions and Medication administration.  Evaluation of  Outcomes: Not Met   RN Treatment Plan for Primary Diagnosis: <principal problem not specified> Long Term Goal(s): Knowledge of disease and therapeutic regimen to maintain health will improve  Short Term Goals: Ability to remain free from injury will improve, Ability to demonstrate self-control, Ability to participate in decision making will improve, Ability to verbalize feelings will improve, Ability to disclose and discuss suicidal ideas, Ability to identify and develop effective coping behaviors will improve, and Compliance with prescribed medications will improve  Medication Management: RN will administer medications as ordered by provider, will assess and evaluate patient's response and provide education to patient for prescribed medication. RN will report any adverse and/or side effects to prescribing provider.  Therapeutic Interventions: 1 on 1 counseling sessions, Psychoeducation, Medication administration, Evaluate responses to treatment, Monitor vital signs and CBGs as ordered, Perform/monitor CIWA, COWS, AIMS and Fall Risk screenings as ordered, Perform wound care treatments as ordered.  Evaluation of Outcomes: Not Met   LCSW Treatment Plan for Primary Diagnosis: <principal problem not specified> Long Term Goal(s): Safe transition to appropriate next level of care at discharge, Engage patient in therapeutic group addressing interpersonal concerns.  Short Term Goals: Engage patient in aftercare planning with referrals and resources, Increase social support, Increase ability to appropriately verbalize feelings, Increase emotional regulation, and Increase skills for wellness and recovery  Therapeutic Interventions: Assess for all discharge needs, 1 to 1 time with Social worker, Explore available resources and support systems, Assess for adequacy in community support network, Educate family and significant other(s) on suicide prevention, Complete Psychosocial Assessment, Interpersonal group  therapy.  Evaluation of Outcomes: Not Met   Progress in Treatment: Attending groups: No. Participating in groups: No. Taking medication as prescribed: Yes. Toleration medication: Yes. Family/Significant other contact made: No, will contact:  when given permission. Patient understands diagnosis: Yes. Discussing patient identified problems/goals with staff: Yes. Medical problems stabilized or resolved: Yes. Denies suicidal/homicidal ideation: Yes. Issues/concerns per patient self-inventory: No. Other: none.  New problem(s) identified: No, Describe:  none.  New Short Term/Long Term Goal(s): medication management for mood stabilization; elimination of SI thoughts; development of comprehensive mental wellness plan.  Patient Goals: Pt expressed desire for help with her depression with either medication or counseling.    Discharge Plan or Barriers: CSW will assist pt with development of an appropriate aftercare/discharge plan.  Reason for Continuation of Hospitalization: Depression Medication stabilization Suicidal ideation  Estimated Length of Stay: 1-7 days   Scribe for Treatment Team: Shirl Harris, LCSW 01/04/2021 9:40 AM

## 2021-01-04 NOTE — [facility] Suicide Risk Assessment (Signed)
[facility] Admission Suicide Risk Assessment   Nursing information obtained from:  Patient Demographic factors:  Low socioeconomic status, Unemployed Current Mental Status:  NA Loss Factors:  Decline in physical health Historical Factors:  Prior suicide attempts, Anniversary of important loss Risk Reduction Factors:  Responsible for children under 41 years of age, Living with another person, especially a relative  Total Time spent with patient: 1 hour Principal Problem: Major depressive disorder, recurrent severe without psychotic features ([facility]) Diagnosis:  Principal Problem:   Major depressive disorder, recurrent severe without psychotic features ([facility]) Active Problems:   Suicide attempt ([facility])   Type 2 diabetes mellitus ([facility])  Subjective Data: Patient presents after suicide attempt via overdose of 20 pills. See H&P for details.   Continued Clinical Symptoms:  Alcohol Use Disorder Identification Test Final Score (AUDIT): 0 The "Alcohol Use Disorders Identification Test", Guidelines for Use in Primary Care, Second Edition.  World Science writer [facility]). Score between 0-7:  no or low risk or alcohol related problems. Score between 8-15:  moderate risk of alcohol related problems. Score between 16-19:  high risk of alcohol related problems. Score 20 or above:  warrants further diagnostic evaluation for alcohol dependence and treatment.   CLINICAL FACTORS:   Severe Anxiety and/or Agitation Depression:   Anhedonia Hopelessness Impulsivity Insomnia Severe Unstable or Poor Therapeutic Relationship Previous Psychiatric Diagnoses and Treatments Medical Diagnoses and Treatments/Surgeries   Musculoskeletal: Strength & Muscle Tone: within normal limits Gait & Station: normal Patient leans: N/A  Psychiatric Specialty Exam:  Presentation  General Appearance: Casual; Appropriate for Environment  Eye Contact:Good  Speech:Clear and Coherent; Normal Rate  Speech  Volume:Normal  Handedness:Right   Mood and Affect  Mood:Depressed; Anxious  Affect:Congruent   Thought Process  Thought Processes:Coherent  Descriptions of Associations:Intact  Orientation:Full (Time, Place and Person)  Thought Content:Logical  History of Schizophrenia/Schizoaffective disorder:No  Duration of Psychotic Symptoms:No data recorded Hallucinations:Hallucinations: None  Ideas of Reference:None  Suicidal Thoughts:Suicidal Thoughts: Yes, Passive  Homicidal Thoughts:Homicidal Thoughts: No   Sensorium  Memory:Immediate Fair; Recent Fair; Remote Fair  Judgment:Impaired  Insight:Present   Executive Functions  Concentration:Good  Attention Span:Good  Recall:Good  Fund of Knowledge:Good  Language:Good   Psychomotor Activity  Psychomotor Activity:Psychomotor Activity: Normal   Assets  Assets:Communication Skills; Desire for Improvement; Housing; Resilience; Social Support   Sleep  Sleep:Sleep: Good Number of Hours of Sleep: 8    Physical Exam: Physical Exam ROS Blood pressure 108/78, pulse 97, temperature 98.6 F (37 C), temperature source Oral, resp. rate 16, height 5\' 6"  (1.676 m), weight 86.2 kg, SpO2 100 %. Body mass index is 30.67 kg/m.   COGNITIVE FEATURES THAT CONTRIBUTE TO RISK:  Loss of executive function    SUICIDE RISK:   Moderate:  Frequent suicidal ideation with limited intensity, and duration, some specificity in terms of plans, no associated intent, good self-control, limited dysphoria/symptomatology, some risk factors present, and identifiable protective factors, including available and accessible social support.  PLAN OF CARE: Continue inpatient admission, see H&P for details.   I certify that inpatient services furnished can reasonably be expected to improve the patient's condition.   , MD 01/04/2021, 1:28 PM

## 2021-01-04 NOTE — Plan of Care (Signed)
Patient pleasant and cooperative on approach.Visible in the milieu. Denies SI,HI and AVH at this time. No PRN medications needed. ADLs maintained. Appetite and energy level good. Support and encouragement given.

## 2021-01-04 NOTE — [facility] Counselor (Signed)
Adult Comprehensive Assessment  Patient ID: Amy Juarez, female   DOB: 22-Dec-1980, 40 y.o.   MRN: [id number]  Information Source: Information source: Patient, Interpreter Lawanna Kobus [phone])  Current Stressors:  Patient states their primary concerns and needs for treatment are:: "I took two sleeping pills" Patient states their goals for this hospitilization and ongoing recovery are:: "get out of here" Educational / Learning stressors: Pt denies. Employment / Job issues: Pt denies. Family Relationships: Pt denies. Financial / Lack of resources (include bankruptcy): Pt denies. Housing / Lack of housing: Pt denies. Physical health (include injuries & life threatening diseases): Pt denies. Social relationships: Pt denies. Substance abuse: Pt denies. Bereavement / Loss: "my daughter"  Living/Environment/Situation:  Living Arrangements: Non-relatives/Friends Who else lives in the home?: "people I know" How long has patient lived in current situation?: "12 years" What is atmosphere in current home: Other (Comment) ("Good")  Family History:  Marital status: Long term relationship Long term relationship, how long?: "18 years" What types of issues is patient dealing with in the relationship?: Pt reports none. Does patient have children?: Yes How many children?: 2 How is patient's relationship with their children?: Pt reports that she lost a daughter 5 years ago.  She describes her relationship with her sons as "good".  She reports sons are 63 and 40 and are currently being supervisied by her boyfriend and her "husband's cousins wife".  Childhood History:  By whom was/is the patient raised?: Both parents Description of patient's relationship with caregiver when they were a child: "good" Patient's description of current relationship with people who raised him/her: Pt reports that her father is deceased.  She reports relationship with mother is "very good". How were you disciplined  when you got in trouble as a child/adolescent?: "they would punish me, kneeling on rice" Does patient have siblings?: Yes Number of Siblings: 7 Description of patient's current relationship with siblings: "so so with the ones that are here,  only 1 sister" Did patient suffer any verbal/emotional/physical/sexual abuse as a child?: No Did patient suffer from severe childhood neglect?: No Has patient ever been sexually abused/assaulted/raped as an adolescent or adult?: No Was the patient ever a victim of a crime or a disaster?: No Witnessed domestic violence?: No Has patient been affected by domestic violence as an adult?: No  Education:  Highest grade of school patient has completed: "high school" Currently a Consulting civil engineer?: No Learning disability?: No  Employment/Work Situation:   Employment Situation: Unemployed What is the Longest Time Patient has Held a Job?: "8 years" 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?: No  Financial Resources:   Financial resources: No income Does patient have a Lawyer or guardian?: No  Alcohol/Substance Abuse:   What has been your use of drugs/alcohol within the last 12 months?: Pt denies. If attempted suicide, did drugs/alcohol play a role in this?: No Alcohol/Substance Abuse Treatment Hx: Denies past history Has alcohol/substance abuse ever caused legal problems?: No  Social Support System:   Patient's Community Support System: Good Describe Community Support System: "my sister, my boyfirend, my brother" Type of faith/religion: "catholic" How does patient's faith help to cope with current illness?: "we go to mass on Sunday"  Leisure/Recreation:   Do You Have Hobbies?: Yes Leisure and Hobbies: "take my kids out to walk, talking, taking my dogs out"  Strengths/Needs:   What is the patient's perception of their strengths?: "I wish I could go back to work" Patient states  these barriers  may affect/interfere with their treatment: Pt denies. Patient states these barriers may affect their return to the community: Pt denies.  Discharge Plan:   Currently receiving community mental health services: No Patient states concerns and preferences for aftercare planning are: Pt reports that she is open to aftercare at this time. Patient states they will know when they are safe and ready for discharge when: "I'm going to get a job and the time is going to go by so fast.  I wont be able to think about anything else." Does patient have access to transportation?: Yes Does patient have financial barriers related to discharge medications?: Yes Patient description of barriers related to discharge medications: Chart indicates that patient does not have insurance at this time. Will patient be returning to same living situation after discharge?: Yes  Summary/Recommendations:   Summary and Recommendations (to be completed by the evaluator): Patient is a 40 year old married female from [place], [place] [facility]  CSW notes that patient is Spanish speaking and assessment was completed with the use of a Spanish interpreter.  She presents to the hospital following concerns for an overdose on medications.  She reports her triggers are the loss of her daughter five years ago and the anniversary of her death is approaching.  She reports that she often becomes depressed when the anniversary approaches.  She reports that she is not current with a mental health provider, though she is hopeful to have a referral for outpatient at discharge.  Recommendations include: crisis stabilization, therapeutic milieu, encourage group attendance and participation, medication management for mood stabilization and development of comprehensive mental wellness/sobriety plan.  Harden Mo. 01/04/2021
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
## 2021-01-04 NOTE — Progress Notes (Signed)
Recreation Therapy Notes  Date: 01/04/2021  Time: 10:00 am   Location: Courtyard    Behavioral response: N/A   Intervention Topic: Wellness   Discussion/Intervention: Patient did not attend group.   Clinical Observations/Feedback:  Patient did not attend group.   Genesi Stefanko LRT/CTRS       Damondre Pfeifle 01/04/2021 11:38 AM 
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
## 2021-01-04 NOTE — H&P (Addendum)
Psychiatric Admission Assessment Adult  Patient Identification: Amy Juarez MRN:  [id number] Date of Evaluation:  01/04/2021 Chief Complaint:  Major depressive disorder, recurrent severe without psychotic features (HCC) [F33.2] Principal Diagnosis: Major depressive disorder, recurrent severe without psychotic features (HCC) Diagnosis:  Principal Problem:   Major depressive disorder, recurrent severe without psychotic features (HCC) Active Problems:   Suicide attempt (HCC)   Type 2 diabetes mellitus (HCC)  CC "I took too many pills."  History of Present Illness: 40 year old female who presented for suicide attempt via intentional ingestion of 20 unknown tablets. She was cleared by poison control, and remained in the emergency room for covid quarantine. She subsequently has transferred to our unit. No acute overnight events, medication compliant, attending to ADLs. Patient was seen during treatment team and again one-on-one with assistance of Spanish interpreter. Patient has been feeling depressed since her child passed away 5 years ago. The anniversary of the death was yesterday. She endorses feelings of worthlessness, hopelessness, sadness, and fatigue. She has been on prozac for roughly 2 years at up to 60 mg daily but felt this just made her feel more tired and depressed. She has also been on Zoloft in the past without improvement. She does not currently see a psychiatrist. Her primary care office offers counseling, but she notes that it is a different provider each time and she finds this unhelpful. She would like to see the same provider each time, and requests we aid her in finding an outpatient provider. We discussed RBA of Cymbalta to assist with depression, anxiety, and neuropathic pain. Patient is agreeable. She has passive SI today. She denies any HI/AH/VH.   Associated Signs/Symptoms: Depression Symptoms:  depressed mood, anhedonia, hypersomnia, fatigue, feelings of  worthlessness/guilt, difficulty concentrating, hopelessness, recurrent thoughts of death, suicidal thoughts without plan, suicidal attempt, loss of energy/fatigue, Duration of Depression Symptoms: Greater than two weeks  (Hypo) Manic Symptoms:   Denies Anxiety Symptoms:  Excessive Worry, Psychotic Symptoms:   Denies PTSD Symptoms: Negative Total Time spent with patient: 1 hour  Past Psychiatric History: History of MDD and GAD. Has past medication trials of Prozac and Zoloft she found unhelpful. No prior hospitalizations. No suicide attempts prior to attempt leading to this admission.   Is the patient at risk to self? Yes.    Has the patient been a risk to self in the past 6 months? No.  Has the patient been a risk to self within the distant past? No.  Is the patient a risk to others? No.  Has the patient been a risk to others in the past 6 months? No.  Has the patient been a risk to others within the distant past? No.   Prior Inpatient Therapy:   Prior Outpatient Therapy:    Alcohol Screening: 1. How often do you have a drink containing alcohol?: Never 2. How many drinks containing alcohol do you have on a typical day when you are drinking?: 1 or 2 3. How often do you have six or more drinks on one occasion?: Never AUDIT-C Score: 0 4. How often during the last year have you found that you were not able to stop drinking once you had started?: Never 5. How often during the last year have you failed to do what was normally expected from you because of drinking?: Never 6. How often during the last year have you needed a first drink in the morning to get yourself going after a heavy drinking session?: Never 7. How often during the  last year have you had a feeling of guilt of remorse after drinking?: Never 8. How often during the last year have you been unable to remember what happened the night before because you had been drinking?: Never 9. Have you or someone else been injured as a  result of your drinking?: No 10. Has a relative or friend or a doctor or another health worker been concerned about your drinking or suggested you cut down?: No Alcohol Use Disorder Identification Test Final Score (AUDIT): 0 Substance Abuse History in the last 12 months:  No. Consequences of Substance Abuse: Negative Previous Psychotropic Medications: Yes  Psychological Evaluations: Yes  Past Medical History:  Past Medical History:  Diagnosis Date  . Anxiety   . Depression   . Diabetes mellitus without complication (HCC)    History reviewed. No pertinent surgical history. Family History: History reviewed. No pertinent family history. Family Psychiatric  History: None reported Tobacco Screening:   Social History:  Social History   Substance and Sexual Activity  Alcohol Use No     Social History   Substance and Sexual Activity  Drug Use Not on file    Additional Social History: Marital status: Long term relationship Long term relationship, how long?: "18 years" What types of issues is patient dealing with in the relationship?: Pt reports none. Does patient have children?: Yes How many children?: 2 How is patient's relationship with their children?: Pt reports that she lost a daughter 5 years ago.  She describes her relationship with her sons as "good".  She reports sons are 26 and 92 and are currently being supervisied by her boyfriend and her "husband's cousins wife".                         Allergies:  No Known Allergies Lab Results:  Results for orders placed or performed during the hospital encounter of 01/03/21 (from the past 48 hour(s))  Glucose, capillary     Status: Abnormal   Collection Time: 01/03/21  4:55 PM  Result Value Ref Range   Glucose-Capillary 422 (H) 70 - 99 mg/dL    Comment: Glucose reference range applies only to samples taken after fasting for at least 8 hours.  Glucose, capillary     Status: Abnormal   Collection Time: 01/03/21  8:32 PM   Result Value Ref Range   Glucose-Capillary 261 (H) 70 - 99 mg/dL    Comment: Glucose reference range applies only to samples taken after fasting for at least 8 hours.  Glucose, capillary     Status: Abnormal   Collection Time: 01/04/21  6:48 AM  Result Value Ref Range   Glucose-Capillary 279 (H) 70 - 99 mg/dL    Comment: Glucose reference range applies only to samples taken after fasting for at least 8 hours.  Glucose, capillary     Status: Abnormal   Collection Time: 01/04/21 10:59 AM  Result Value Ref Range   Glucose-Capillary 297 (H) 70 - 99 mg/dL    Comment: Glucose reference range applies only to samples taken after fasting for at least 8 hours.    Blood Alcohol level:  Lab Results  Component Value Date   ETH <10 12/29/2020    Metabolic Disorder Labs:  Lab Results  Component Value Date   HGBA1C 10.8 (H) 12/29/2020   MPG 263 12/29/2020   No results found for: PROLACTIN No results found for: CHOL, TRIG, HDL, CHOLHDL, VLDL, LDLCALC  Current Medications: Current Facility-Administered Medications  Medication Dose Route Frequency Provider Last Rate Last Admin  . acetaminophen (TYLENOL) tablet 650 mg  650 mg Oral Q6H PRN Charm Rings, NP   650 mg at 01/04/21 0646  . alum & mag hydroxide-simeth (MAALOX/MYLANTA) 200-200-20 MG/5ML suspension 30 mL  30 mL Oral Q4H PRN Charm Rings, NP      . atorvastatin (LIPITOR) tablet 40 mg  40 mg Oral QHS Charm Rings, NP      . DULoxetine (CYMBALTA) DR capsule 30 mg  30 mg Oral Daily Les Pou M, MD      . gabapentin (NEURONTIN) capsule 300 mg  300 mg Oral TID Charm Rings, NP   300 mg at 01/04/21 1207  . insulin aspart (novoLOG) injection 0-15 Units  0-15 Units Subcutaneous TID WC Charm Rings, NP   8 Units at 01/04/21 1146  . insulin aspart (novoLOG) injection 0-5 Units  0-5 Units Subcutaneous QHS Charm Rings, NP   3 Units at 01/03/21 2037  . insulin aspart (novoLOG) injection 6 Units  6 Units Subcutaneous TID WC  Jesse Sans, MD   6 Units at 01/04/21 1207  . insulin detemir (LEVEMIR) injection 25 Units  25 Units Subcutaneous BID Jesse Sans, MD      . magnesium hydroxide (MILK OF MAGNESIA) suspension 30 mL  30 mL Oral Daily PRN Charm Rings, NP      . metFORMIN (GLUCOPHAGE) tablet 1,000 mg  1,000 mg Oral BID WC Jesse Sans, MD   1,000 mg at 01/04/21 0805  . pantoprazole (PROTONIX) EC tablet 80 mg  80 mg Oral Daily Charm Rings, NP   80 mg at 01/04/21 0805   PTA Medications: Medications Prior to Admission  Medication Sig Dispense Refill Last Dose  . atorvastatin (LIPITOR) 40 MG tablet Take 40 mg by mouth at bedtime.     Marland Kitchen FLUoxetine HCl 60 MG TABS Take 1 tablet by mouth daily.     Marland Kitchen gabapentin (NEURONTIN) 300 MG capsule Take 300 mg by mouth 3 (three) times daily.     . hydrOXYzine (ATARAX/VISTARIL) 25 MG tablet Take 25 mg by mouth at bedtime as needed.     Marland Kitchen ibuprofen (ADVIL) 800 MG tablet Take 800 mg by mouth every 8 (eight) hours as needed. (Patient not taking: No sig reported)     . INVOKANA 300 MG TABS tablet Take 300 mg by mouth daily.     Marland Kitchen LEVEMIR FLEXTOUCH 100 UNIT/ML FlexTouch Pen Inject 55 Units into the skin in the morning and at bedtime.     . metFORMIN (GLUCOPHAGE) 1000 MG tablet Take 1,000 mg by mouth 2 (two) times daily.     Marland Kitchen omeprazole (PRILOSEC) 20 MG capsule Take 1 capsule by mouth daily.     . ondansetron (ZOFRAN) 8 MG tablet Take 8 mg by mouth 3 (three) times daily.     Marland Kitchen tiZANidine (ZANAFLEX) 4 MG tablet Take 4 mg by mouth 2 (two) times daily as needed.     Marland Kitchen VICTOZA 18 MG/3ML SOPN Inject into the skin.       Musculoskeletal: Strength & Muscle Tone: within normal limits Gait & Station: normal Patient leans: N/A            Psychiatric Specialty Exam:  Presentation  General Appearance: Casual; Appropriate for Environment  Eye Contact:Good  Speech:Clear and Coherent; Normal Rate  Speech Volume:Normal  Handedness:Right   Mood and Affect   Mood:Depressed; Anxious  Affect:Congruent   Thought  Process  Thought Processes:Coherent  Duration of Psychotic Symptoms: No data recorded Past Diagnosis of Schizophrenia or Psychoactive disorder: No  Descriptions of Associations:Intact  Orientation:Full (Time, Place and Person)  Thought Content:Logical  Hallucinations:Hallucinations: None  Ideas of Reference:None  Suicidal Thoughts:Suicidal Thoughts: Yes, Passive  Homicidal Thoughts:Homicidal Thoughts: No   Sensorium  Memory:Immediate Fair; Recent Fair; Remote Fair  Judgment:Impaired  Insight:Present   Executive Functions  Concentration:Good  Attention Span:Good  Recall:Good  Fund of Knowledge:Good  Language:Good   Psychomotor Activity  Psychomotor Activity:Psychomotor Activity: Normal   Assets  Assets:Communication Skills; Desire for Improvement; Housing; Resilience; Social Support   Sleep  Sleep:Sleep: Good Number of Hours of Sleep: 8    Physical Exam: Physical Exam Vitals and nursing note reviewed.  Constitutional:      Appearance: Normal appearance.  HENT:     Head: Normocephalic and atraumatic.     Right Ear: External ear normal.     Left Ear: External ear normal.     Nose: Nose normal.     Mouth/Throat:     Mouth: Mucous membranes are moist.     Pharynx: Oropharynx is clear.  Eyes:     Extraocular Movements: Extraocular movements intact.     Conjunctiva/sclera: Conjunctivae normal.     Pupils: Pupils are equal, round, and reactive to light.  Cardiovascular:     Rate and Rhythm: Normal rate.     Pulses: Normal pulses.  Pulmonary:     Effort: Pulmonary effort is normal.     Breath sounds: Normal breath sounds.  Abdominal:     General: Abdomen is flat.     Palpations: Abdomen is soft.  Musculoskeletal:        General: No swelling. Normal range of motion.     Cervical back: Normal range of motion and neck supple.  Skin:    General: Skin is warm and dry.  Neurological:      General: No focal deficit present.     Mental Status: She is alert and oriented to person, place, and time.  Psychiatric:        Attention and Perception: Attention normal.        Mood and Affect: Mood is depressed. Affect is tearful.        Speech: Speech normal.        Behavior: Behavior normal. Behavior is cooperative.        Thought Content: Thought content includes suicidal ideation. Thought content does not include suicidal plan.        Cognition and Memory: Cognition and memory normal.        Judgment: Judgment is impulsive.   Review of Systems  Constitutional: Negative.   HENT: Negative.    Eyes: Negative.   Respiratory: Negative.    Cardiovascular: Negative.   Gastrointestinal: Negative.   Genitourinary: Negative.   Musculoskeletal: Negative.   Skin: Negative.   Neurological: Negative.   Endo/Heme/Allergies: Negative.   Psychiatric/Behavioral:  Positive for depression and suicidal ideas. Negative for hallucinations and memory loss. The patient is nervous/anxious. The patient does not have insomnia.   Blood pressure 108/78, pulse 97, temperature 98.6 F (37 C), temperature source Oral, resp. rate 16, height 5\' 6"  (1.676 m), weight 86.2 kg, SpO2 100 %. Body mass index is 30.67 kg/m.  Treatment Plan Summary: Daily contact with patient to assess and evaluate symptoms and progress in treatment and Medication management  1) MDD, recurrent, severe without psychosis - Discontinue Prozac, start Cymbalta 30 mg daily  2) Diabetes Melltius Type  2, poorly controlled - Appreciate diabetic coordinator input - Increase Levemir 25 mg BID, start Novolog 6 units TID with meals, correctional scale TID AC +HS, metformin 1000 mg BID - Gabapentin 300 mg TID for neuropathic pain  3) HLD - Continue Lipitor 40 mg daily   4) GERD - Protonix 80 mg daily   Observation Level/Precautions:  15 minute checks  Laboratory:   Completed in ED  Psychotherapy:    Medications:    Consultations:     Discharge Concerns:    Estimated LOS:  Other:     Physician Treatment Plan for Primary Diagnosis: Major depressive disorder, recurrent severe without psychotic features (HCC) Long Term Goal(s): Improvement in symptoms so as ready for discharge  Short Term Goals: Ability to identify changes in lifestyle to reduce recurrence of condition will improve, Ability to verbalize feelings will improve, Ability to disclose and discuss suicidal ideas, Ability to demonstrate self-control will improve, Ability to identify and develop effective coping behaviors will improve, Ability to maintain clinical measurements within normal limits will improve, Compliance with prescribed medications will improve, and Ability to identify triggers associated with substance abuse/mental health issues will improve  Physician Treatment Plan for Secondary Diagnosis: Principal Problem:   Major depressive disorder, recurrent severe without psychotic features (HCC) Active Problems:   Suicide attempt (HCC)   Type 2 diabetes mellitus (HCC)  Long Term Goal(s): Improvement in symptoms so as ready for discharge  Short Term Goals: Ability to identify changes in lifestyle to reduce recurrence of condition will improve, Ability to verbalize feelings will improve, Ability to disclose and discuss suicidal ideas, Ability to demonstrate self-control will improve, Ability to identify and develop effective coping behaviors will improve, Ability to maintain clinical measurements within normal limits will improve, Compliance with prescribed medications will improve, and Ability to identify triggers associated with substance abuse/mental health issues will improve  I certify that inpatient services furnished can reasonably be expected to improve the patient's condition.    Jesse Sans, MD 9/26/20221:30 PM

## 2021-01-04 NOTE — Progress Notes (Signed)
Inpatient Diabetes Program Recommendations  AACE/ADA: New Consensus Statement on Inpatient Glycemic Control (2015)  Target Ranges:  Prepandial:   less than 140 mg/dL      Peak postprandial:   less than 180 mg/dL (1-2 hours)      Critically ill patients:  140 - 180 mg/dL   Results for Amy Juarez, Amy Juarez (MRN [id number]) as of 01/04/2021 09:32  Ref. Range 01/03/2021 07:33 01/03/2021 12:08 01/03/2021 16:55 01/03/2021 20:32  Glucose-Capillary Latest Ref Range: 70 - 99 mg/dL 026 (H)  8 units Novolog  337 (H)  11 units Novolog  15 units Levemir 422 (H)  18 units Novolog  261 (H)  3 units Novolog  15 units Levemir   Results for Amy Juarez, Amy Juarez (MRN [id number]) as of 01/04/2021 09:32  Ref. Range 01/04/2021 06:48  Glucose-Capillary Latest Ref Range: 70 - 99 mg/dL 774 (H)  8 units Novolog  15 units Levemir   Admit with: Suicide attempt/ MDD (major depressive disorder)/ COVID+  History: DM2  Home DM Meds: Levemir 55 units BID      Invokana 300 daily      Metformin 1000 mg BID      Victoza (no dose or frequency on home med list)    Current Orders: Levemir 15 units BID                            Novolog Moderate Correction Scale/ SSI (0-15 units) TID AC + HS      Metformin 1000 mg BID    MD- Note CBGs >250.  Please consider:  1. Increase Levemir to 25 units BID (~50% total home dose)  2. Start Novolog Meal Coverage: Novolog 6 units TID with meals Hold if pt eats <50% of meal, Hold if pt NPO    --Will follow patient during hospitalization--  Ambrose Finland RN, MSN, CDE Diabetes Coordinator Inpatient Glycemic Control Team Team Pager: [phone] (8a-5p)

## 2021-01-04 NOTE — Progress Notes (Signed)
Blood glucose 279. No acute distress noted. Patient given tylenol for complaints of mild headache (see MAR). Remains safe on the unit with q15 min safety checks.

## 2021-01-05 DIAGNOSIS — F332 Major depressive disorder, recurrent severe without psychotic features: Secondary | ICD-10-CM | POA: Diagnosis not present

## 2021-01-05 LAB — GLUCOSE, CAPILLARY
Glucose-Capillary: 210 mg/dL — ABNORMAL HIGH (ref 70–99)
Glucose-Capillary: 393 mg/dL — ABNORMAL HIGH (ref 70–99)
Glucose-Capillary: 172 mg/dL — ABNORMAL HIGH (ref 70–99)
Glucose-Capillary: 217 mg/dL — ABNORMAL HIGH (ref 70–99)

## 2021-01-05 MED ORDER — INSULIN ASPART 100 UNIT/ML IJ SOLN
10.0000 [IU] | Freq: Three times a day (TID) | INTRAMUSCULAR | Status: DC
  Administered 2021-01-05 – 2021-01-06 (×3): 10 [IU] via SUBCUTANEOUS
  Filled 2021-01-05 (×3): qty 1

## 2021-01-05 NOTE — Plan of Care (Signed)
  Problem: Education: Goal: Knowledge of [facility] Education information/materials will improve Outcome: Progressing Goal: Emotional status will improve Outcome: Progressing Goal: Mental status will improve Outcome: Progressing Goal: Verbalization of understanding the information provided will improve Outcome: Progressing   Problem: Activity: Goal: Interest or engagement in activities will improve Outcome: Progressing Goal: Sleeping patterns will improve Outcome: Progressing   Problem: Coping: Goal: Ability to verbalize frustrations and anger appropriately will improve Outcome: Progressing Goal: Ability to demonstrate self-control will improve Outcome: Progressing   Problem: Health Behavior/Discharge Planning: Goal: Identification of resources available to assist in meeting health care needs will improve Outcome: Progressing Goal: Compliance with treatment plan for underlying cause of condition will improve Outcome: Progressing   Problem: Physical Regulation: Goal: Ability to maintain clinical measurements within normal limits will improve Outcome: Progressing   Problem: Safety: Goal: Periods of time without injury will increase Outcome: Progressing   Problem: Education: Goal: Utilization of techniques to improve thought processes will improve Outcome: Progressing Goal: Knowledge of the prescribed therapeutic regimen will improve Outcome: Progressing   Problem: Activity: Goal: Interest or engagement in leisure activities will improve Outcome: Progressing Goal: Imbalance in normal sleep/wake cycle will improve Outcome: Progressing   Problem: Coping: Goal: Coping ability will improve Outcome: Progressing Goal: Will verbalize feelings Outcome: Progressing   Problem: Health Behavior/Discharge Planning: Goal: Ability to make decisions will improve Outcome: Progressing Goal: Compliance with therapeutic regimen will improve Outcome: Progressing    Problem: Role Relationship: Goal: Will demonstrate positive changes in social behaviors and relationships Outcome: Progressing   Problem: Safety: Goal: Ability to disclose and discuss suicidal ideas will improve Outcome: Progressing Goal: Ability to identify and utilize support systems that promote safety will improve Outcome: Progressing   Problem: Self-Concept: Goal: Will verbalize positive feelings about self Outcome: Progressing Goal: Level of anxiety will decrease Outcome: Progressing   Problem: Education: Goal: Ability to state activities that reduce stress will improve Outcome: Progressing   Problem: Coping: Goal: Ability to identify and develop effective coping behavior will improve Outcome: Progressing   Problem: Self-Concept: Goal: Ability to identify factors that promote anxiety will improve Outcome: Progressing Goal: Level of anxiety will decrease Outcome: Progressing Goal: Ability to modify response to factors that promote anxiety will improve Outcome: Progressing   

## 2021-01-05 NOTE — Progress Notes (Signed)
Inpatient Diabetes Program Recommendations  AACE/ADA: New Consensus Statement on Inpatient Glycemic Control (2015)  Target Ranges:  Prepandial:   less than 140 mg/dL      Peak postprandial:   less than 180 mg/dL (1-2 hours)      Critically ill patients:  140 - 180 mg/dL  Results for NIKOLETTE, REINDL (MRN [id number]) as of 01/05/2021 12:57  Ref. Range 01/04/2021 06:48 01/04/2021 10:59 01/04/2021 16:12 01/04/2021 20:49  Glucose-Capillary Latest Ref Range: 70 - 99 mg/dL 132 (H)  8 units Novolog  15 units Levemir  297 (H)  14 units Novolog  249 (H)  11 units Novolog  229 (H)  2 units Novolog  25 units Levemir   Results for KIELY, COUSAR (MRN [id number]) as of 01/05/2021 12:57  Ref. Range 01/05/2021 06:55 01/05/2021 11:56  Glucose-Capillary Latest Ref Range: 70 - 99 mg/dL 366 (H)  11 units Novolog  25 units Levemir  393 (H)  21 units Novolog     Home DM Meds: Levemir 55 units BID      Invokana 300 daily      Metformin 1000 mg BID      Victoza (no dose or frequency on home med list)     Current Orders: Levemir 25 units BID                            Novolog Moderate Correction Scale/ SSI (0-15 units) TID AC + HS                            Metformin 1000 mg BID       Novolog 6 units TID with meals     MD- Note Levemir increased last PM and also note Novolog 6 units TID for meal coverage started yesterday as well.  Please consider increasing the Novolog Meal Coverage further to 10 units TID with meals    --Will follow patient during hospitalization--  Ambrose Finland RN, MSN, CDE Diabetes Coordinator Inpatient Glycemic Control Team Team Pager: [phone] (8a-5p)

## 2021-01-05 NOTE — Plan of Care (Signed)
Patient slept on & off during this shift since she could not sleep last night. Stated that she is feeling better today.Denies SI,HI and AVH. Patient verbalized nausea this afternoon and resolved with Mylanta. Appetite and energy level good. ADLs maintained. Support and encouragement given.

## 2021-01-05 NOTE — Progress Notes (Signed)
[facility] MD Progress Note  01/05/2021 11:35 AM Amy Juarez  MRN:  [id number]  CC: "Little bit good."  Subjective:  40 year old female who presented for suicide attempt via intentional ingestion of 20 unknown tablets. No acute events overnight, medication compliant, attending to ADLs. Seen today with assistance of spanish interpretor. She states she is "little bit good." She had difficulty sleeping overnight secondary to a peer being loud on the unit. She notes that this caused her great anxiety, and she feared leaving her room. She notes that the patient is loud in the dayroom and outside as well. She denies any side effects from starting Cymbalta. Specifically not nausea, vomiting, diarrhea, or headache. She feels that she is getting good care from [facility] and is hoping that if we call she could be assigned a therapist to see consistently. She is also open to referral for alternate therapists. She denies SI/HI/AH/VH.  Principal Problem: Major depressive disorder, recurrent severe without psychotic features (HCC) Diagnosis: Principal Problem:   Major depressive disorder, recurrent severe without psychotic features (HCC) Active Problems:   Suicide attempt (HCC)   Type 2 diabetes mellitus (HCC)  Total Time spent with patient: 30 minutes  Past Psychiatric History: See H&P  Past Medical History:  Past Medical History:  Diagnosis Date   Anxiety    Depression    Diabetes mellitus without complication (HCC)    History reviewed. No pertinent surgical history. Family History: History reviewed. No pertinent family history. Family Psychiatric  History: See H&P Social History:  Social History   Substance and Sexual Activity  Alcohol Use No     Social History   Substance and Sexual Activity  Drug Use Not on file    Social History   Socioeconomic History   Marital status: Married    Spouse name: Not on file   Number of children: Not on file   Years of  education: Not on file   Highest education level: Not on file  Occupational History   Not on file  Tobacco Use   Smoking status: Never    Passive exposure: Never   Smokeless tobacco: Never  Vaping Use   Vaping Use: Never used  Substance and Sexual Activity   Alcohol use: No   Drug use: Not on file   Sexual activity: Not on file  Other Topics Concern   Not on file  Social History Narrative   Not on file   Social Determinants of Health   Financial Resource Strain: Not on file  Food Insecurity: Not on file  Transportation Needs: Not on file  Physical Activity: Not on file  Stress: Not on file  Social Connections: Not on file   Additional Social History:                         Sleep: Poor  Appetite:  Fair  Current Medications: Current Facility-Administered Medications  Medication Dose Route Frequency Provider Last Rate Last Admin   acetaminophen (TYLENOL) tablet 650 mg  650 mg Oral Q6H PRN Charm Rings, NP   650 mg at 01/05/21 0652   alum & mag hydroxide-simeth (MAALOX/MYLANTA) 200-200-20 MG/5ML suspension 30 mL  30 mL Oral Q4H PRN Charm Rings, NP       atorvastatin (LIPITOR) tablet 40 mg  40 mg Oral QHS Charm Rings, NP   40 mg at 01/04/21 2050   DULoxetine (CYMBALTA) DR capsule 30 mg  30 mg Oral Daily Neale Burly,  Tylene Fantasia, MD   30 mg at 01/05/21 0743   gabapentin (NEURONTIN) capsule 300 mg  300 mg Oral TID Charm Rings, NP   300 mg at 01/05/21 0743   insulin aspart (novoLOG) injection 0-15 Units  0-15 Units Subcutaneous TID WC Charm Rings, NP   5 Units at 01/05/21 0743   insulin aspart (novoLOG) injection 0-5 Units  0-5 Units Subcutaneous QHS Charm Rings, NP   2 Units at 01/04/21 2053   insulin aspart (novoLOG) injection 6 Units  6 Units Subcutaneous TID WC Jesse Sans, MD   6 Units at 01/05/21 0744   insulin detemir (LEVEMIR) injection 25 Units  25 Units Subcutaneous BID Jesse Sans, MD   25 Units at 01/05/21 0827   magnesium  hydroxide (MILK OF MAGNESIA) suspension 30 mL  30 mL Oral Daily PRN Charm Rings, NP       metFORMIN (GLUCOPHAGE) tablet 1,000 mg  1,000 mg Oral BID WC Jesse Sans, MD   1,000 mg at 01/05/21 0743   pantoprazole (PROTONIX) EC tablet 80 mg  80 mg Oral Daily Charm Rings, NP   80 mg at 01/05/21 1275    Lab Results:  Results for orders placed or performed during the hospital encounter of 01/03/21 (from the past 48 hour(s))  Glucose, capillary     Status: Abnormal   Collection Time: 01/03/21  4:55 PM  Result Value Ref Range   Glucose-Capillary 422 (H) 70 - 99 mg/dL    Comment: Glucose reference range applies only to samples taken after fasting for at least 8 hours.  Glucose, capillary     Status: Abnormal   Collection Time: 01/03/21  8:32 PM  Result Value Ref Range   Glucose-Capillary 261 (H) 70 - 99 mg/dL    Comment: Glucose reference range applies only to samples taken after fasting for at least 8 hours.  Glucose, capillary     Status: Abnormal   Collection Time: 01/04/21  6:48 AM  Result Value Ref Range   Glucose-Capillary 279 (H) 70 - 99 mg/dL    Comment: Glucose reference range applies only to samples taken after fasting for at least 8 hours.  Glucose, capillary     Status: Abnormal   Collection Time: 01/04/21 10:59 AM  Result Value Ref Range   Glucose-Capillary 297 (H) 70 - 99 mg/dL    Comment: Glucose reference range applies only to samples taken after fasting for at least 8 hours.  Glucose, capillary     Status: Abnormal   Collection Time: 01/04/21  4:12 PM  Result Value Ref Range   Glucose-Capillary 249 (H) 70 - 99 mg/dL    Comment: Glucose reference range applies only to samples taken after fasting for at least 8 hours.  Glucose, capillary     Status: Abnormal   Collection Time: 01/04/21  8:49 PM  Result Value Ref Range   Glucose-Capillary 229 (H) 70 - 99 mg/dL    Comment: Glucose reference range applies only to samples taken after fasting for at least 8 hours.    Comment 1 Notify RN   Glucose, capillary     Status: Abnormal   Collection Time: 01/05/21  6:55 AM  Result Value Ref Range   Glucose-Capillary 210 (H) 70 - 99 mg/dL    Comment: Glucose reference range applies only to samples taken after fasting for at least 8 hours.   Comment 1 Notify RN     Blood Alcohol level:  Lab Results  Component Value Date   ETH <10 12/29/2020    Metabolic Disorder Labs: Lab Results  Component Value Date   HGBA1C 10.8 (H) 12/29/2020   MPG 263 12/29/2020   No results found for: PROLACTIN No results found for: CHOL, TRIG, HDL, CHOLHDL, VLDL, LDLCALC  Physical Findings: AIMS:  , ,  ,  ,    CIWA:    COWS:     Musculoskeletal: Strength & Muscle Tone: within normal limits Gait & Station: normal Patient leans: N/A  Psychiatric Specialty Exam:  Presentation  General Appearance: Casual; Appropriate for Environment  Eye Contact:Good  Speech:Clear and Coherent; Normal Rate  Speech Volume:Normal  Handedness:Right   Mood and Affect  Mood:Depressed; Anxious  Affect:Congruent   Thought Process  Thought Processes:Coherent  Descriptions of Associations:Intact  Orientation:Full (Time, Place and Person)  Thought Content:Logical  History of Schizophrenia/Schizoaffective disorder:No  Duration of Psychotic Symptoms:No data recorded Hallucinations:Hallucinations: None  Ideas of Reference:None  Suicidal Thoughts:Suicidal Thoughts: Yes, Passive  Homicidal Thoughts:Homicidal Thoughts: No   Sensorium  Memory:Immediate Fair; Recent Fair; Remote Fair  Judgment:Impaired  Insight:Present   Executive Functions  Concentration:Good  Attention Span:Good  Recall:Good  Fund of Knowledge:Good  Language:Good   Psychomotor Activity  Psychomotor Activity:Psychomotor Activity: Normal   Assets  Assets:Communication Skills; Desire for Improvement; Housing; Resilience; Social Support   Sleep  Sleep:Sleep: Good Number of Hours of  Sleep: 8    Physical Exam: Physical Exam ROS Blood pressure 121/88, pulse 94, temperature 97.8 F (36.6 C), temperature source Oral, resp. rate 18, height 5\' 6"  (1.676 m), weight 86.2 kg, SpO2 100 %. Body mass index is 30.67 kg/m.   Treatment Plan Summary: Daily contact with patient to assess and evaluate symptoms and progress in treatment and Medication management  1) MDD, recurrent, severe without psychosis - Continue Cymbalta 30 mg daily   2) Diabetes Melltius Type 2, poorly controlled - Appreciate diabetic coordinator input - ContinueLevemir 25 mg BID, Novolog 6 units TID with meals, correctional scale TID AC +HS, metformin 1000 mg BID - Gabapentin 300 mg TID for neuropathic pain   3) HLD - Continue Lipitor 40 mg daily    4) GERD - Protonix 80 mg daily  , MD 01/05/2021, 11:35 AM

## 2021-01-05 NOTE — Progress Notes (Signed)
Patient has been pleasant and cooperative. Is able to communicate with staff  without interpreter enough to get assistance when she needs something on the spur of the moment, although her English is limited. Denies SI. Appears brighter.

## 2021-01-05 NOTE — Progress Notes (Signed)
Recreation Therapy Notes   Date: 01/05/2021  Time: 10:15 am   Location: Courtyard    Behavioral response: N/A   Intervention Topic: Leisure    Discussion/Intervention: Patient did not attend group.   Clinical Observations/Feedback:  Patient did not attend group.   Lavida Patch LRT/CTRS         Tomeika Weinmann 01/05/2021 11:58 AM

## 2021-01-06 ENCOUNTER — Other Ambulatory Visit: Payer: Self-pay

## 2021-01-06 DIAGNOSIS — F332 Major depressive disorder, recurrent severe without psychotic features: Secondary | ICD-10-CM | POA: Diagnosis not present

## 2021-01-06 LAB — GLUCOSE, CAPILLARY
Glucose-Capillary: 194 mg/dL — ABNORMAL HIGH (ref 70–99)
Glucose-Capillary: 221 mg/dL — ABNORMAL HIGH (ref 70–99)
Glucose-Capillary: 309 mg/dL — ABNORMAL HIGH (ref 70–99)

## 2021-01-06 MED ORDER — DULOXETINE HCL 30 MG PO CPEP
30.0000 mg | ORAL_CAPSULE | Freq: Every day | ORAL | 0 refills | Status: DC
  Filled 2021-01-06: qty 7, 7d supply, fill #0

## 2021-01-06 MED ORDER — DULOXETINE HCL 30 MG PO CPEP: 30.0000 mg | ORAL_CAPSULE | Freq: Every day | ORAL | 1 refills | Status: DC

## 2021-01-06 NOTE — Discharge Summary (Signed)
Physician Discharge Summary Note  Patient:  Amy Juarez is an 40 y.o., female MRN:  [id number] DOB:  26-Aug-1980 Patient phone:  [phone] (home)  Patient address:   [address] [place] [address],  Total Time spent with patient: 35 minutes- 25 minutes face-to-face contact with patient, 10 minutes documentation, coordination of care, scripts   Date of Admission:  01/03/2021 Date of Discharge: 01/06/2021  Reason for Admission:  40 year old female who presented for suicide attempt via intentional ingestion of 20 unknown tablets  Principal Problem: Major depressive disorder, recurrent severe without psychotic features (HCC) Discharge Diagnoses: Principal Problem:   Major depressive disorder, recurrent severe without psychotic features (HCC) Active Problems:   Suicide attempt (HCC)   Type 2 diabetes mellitus (HCC)   Past Psychiatric History: History of MDD and GAD. Has past medication trials of Prozac and Zoloft she found unhelpful. No prior hospitalizations. No suicide attempts prior to attempt leading to this admission.   Past Medical History:  Past Medical History:  Diagnosis Date   Anxiety    Depression    Diabetes mellitus without complication (HCC)    History reviewed. No pertinent surgical history. Family History: History reviewed. No pertinent family history. Family Psychiatric  History: None reported Social History:  Social History   Substance and Sexual Activity  Alcohol Use No     Social History   Substance and Sexual Activity  Drug Use Not on file    Social History   Socioeconomic History   Marital status: Married    Spouse name: Not on file   Number of children: Not on file   Years of education: Not on file   Highest education level: Not on file  Occupational History   Not on file  Tobacco Use   Smoking status: Never    Passive exposure: Never   Smokeless tobacco: Never  Vaping Use   Vaping Use: Never used  Substance  and Sexual Activity   Alcohol use: No   Drug use: Not on file   Sexual activity: Not on file  Other Topics Concern   Not on file  Social History Narrative   Not on file   Social Determinants of Health   Financial Resource Strain: Not on file  Food Insecurity: Not on file  Transportation Needs: Not on file  Physical Activity: Not on file  Stress: Not on file  Social Connections: Not on file    Hospital Course:  40 year old female who presented for suicide attempt via intentional ingestion of 20 unknown tablets. She was cleared by poison control, and remained in the emergency room for covid quarantine. She had previously been on Prozac up to 60 mg for 2 years without improvement. She had also failed trial of Zoloft. Started Cymbalta 30 mg daily without any side effects. Patient interacted well with staff and peers. She denies SI/HI/AH/VH. She wishes to continued outpatient therapy at discharge. She was instructed to continued her home diabetic medications and follow up with PCP.   Physical Findings: AIMS:  , ,  ,  ,    CIWA:    COWS:     Musculoskeletal: Strength & Muscle Tone: within normal limits Gait & Station: normal Patient leans: N/A   Psychiatric Specialty Exam:  Presentation  General Appearance: Appropriate for Environment; Casual  Eye Contact:Good  Speech:Clear and Coherent; Normal Rate  Speech Volume:Normal  Handedness:Right   Mood and Affect  Mood:Euthymic  Affect:Congruent   Thought Process  Thought Processes:Coherent  Descriptions of  Associations:Intact  Orientation:Full (Time, Place and Person)  Thought Content:Logical  History of Schizophrenia/Schizoaffective disorder:No  Duration of Psychotic Symptoms:No data recorded Hallucinations:Hallucinations: None  Ideas of Reference:None  Suicidal Thoughts:Suicidal Thoughts: No  Homicidal Thoughts:Homicidal Thoughts: No   Sensorium  Memory:Immediate Good; Recent Good; Remote  Good  Judgment:Intact  Insight:Present   Executive Functions  Concentration:Good  Attention Span:Good  Recall:Good  Fund of Knowledge:Good  Language:Good   Psychomotor Activity  Psychomotor Activity:Psychomotor Activity: Normal   Assets  Assets:Communication Skills; Desire for Improvement; Housing; Intimacy; Resilience; Social Support; Talents/Skills   Sleep  Sleep:Sleep: Good    Physical Exam: Physical Exam Vitals and nursing note reviewed.  Constitutional:      Appearance: Normal appearance.  HENT:     Head: Normocephalic and atraumatic.     Right Ear: External ear normal.     Left Ear: External ear normal.     Nose: Nose normal.     Mouth/Throat:     Mouth: Mucous membranes are moist.     Pharynx: Oropharynx is clear.  Eyes:     Extraocular Movements: Extraocular movements intact.     Conjunctiva/sclera: Conjunctivae normal.     Pupils: Pupils are equal, round, and reactive to light.  Cardiovascular:     Rate and Rhythm: Normal rate.     Pulses: Normal pulses.  Pulmonary:     Effort: Pulmonary effort is normal.     Breath sounds: Normal breath sounds.  Abdominal:     General: Abdomen is flat.     Palpations: Abdomen is soft.  Musculoskeletal:        General: No swelling. Normal range of motion.     Cervical back: Normal range of motion and neck supple.  Skin:    General: Skin is warm and dry.  Neurological:     General: No focal deficit present.     Mental Status: She is alert and oriented to person, place, and time.  Psychiatric:        Mood and Affect: Mood normal.        Behavior: Behavior normal.        Thought Content: Thought content normal.        Judgment: Judgment normal.   Review of Systems  Constitutional: Negative.   HENT: Negative.    Eyes: Negative.   Respiratory: Negative.    Cardiovascular: Negative.   Gastrointestinal: Negative.   Genitourinary: Negative.   Musculoskeletal: Negative.   Skin: Negative.    Neurological: Negative.   Endo/Heme/Allergies: Negative.   Psychiatric/Behavioral:  Negative for hallucinations, memory loss, substance abuse and suicidal ideas. The patient is not nervous/anxious and does not have insomnia.   Blood pressure 118/81, pulse 90, temperature 98.5 F (36.9 C), temperature source Oral, resp. rate 18, height 5\' 6"  (1.676 m), weight 86.2 kg, SpO2 100 %. Body mass index is 30.67 kg/m.   Social History   Tobacco Use  Smoking Status Never   Passive exposure: Never  Smokeless Tobacco Never   Tobacco Cessation:  N/A, patient does not currently use tobacco products   Blood Alcohol level:  Lab Results  Component Value Date   ETH <10 12/29/2020    Metabolic Disorder Labs:  Lab Results  Component Value Date   HGBA1C 10.8 (H) 12/29/2020   MPG 263 12/29/2020   No results found for: PROLACTIN No results found for: CHOL, TRIG, HDL, CHOLHDL, VLDL, LDLCALC  See Psychiatric Specialty Exam and Suicide Risk Assessment completed by Attending Physician prior to discharge.  Discharge destination:  Home  Is patient on multiple antipsychotic therapies at discharge:  No   Has Patient had three or more failed trials of antipsychotic monotherapy by history:  No  Recommended Plan for Multiple Antipsychotic Therapies: NA  Discharge Instructions     Diet Carb Modified   Complete by: As directed    Increase activity slowly   Complete by: As directed       Allergies as of 01/06/2021   No Known Allergies      Medication List     STOP taking these medications    FLUoxetine HCl 60 MG Tabs   hydrOXYzine 25 MG tablet Commonly known as: ATARAX/VISTARIL   ibuprofen 800 MG tablet Commonly known as: ADVIL   ondansetron 8 MG tablet Commonly known as: ZOFRAN   tiZANidine 4 MG tablet Commonly known as: ZANAFLEX       TAKE these medications      Indication  atorvastatin 40 MG tablet Commonly known as: LIPITOR Take 40 mg by mouth at bedtime.   Indication: High Amount of Fats in the Blood   DULoxetine 30 MG capsule Commonly known as: CYMBALTA Take 1 capsule (30 mg total) by mouth daily. Start taking on: January 07, 2021  Indication: Generalized Anxiety Disorder, Major Depressive Disorder   gabapentin 300 MG capsule Commonly known as: NEURONTIN Take 300 mg by mouth 3 (three) times daily.  Indication: Fibromyalgia Syndrome   Invokana 300 MG Tabs tablet Generic drug: canagliflozin Take 300 mg by mouth daily.  Indication: Type 2 Diabetes   Levemir FlexTouch 100 UNIT/ML FlexPen Generic drug: insulin detemir Inject 55 Units into the skin in the morning and at bedtime.  Indication: Type 2 Diabetes   metFORMIN 1000 MG tablet Commonly known as: GLUCOPHAGE Take 1,000 mg by mouth 2 (two) times daily.  Indication: Type 2 Diabetes   omeprazole 20 MG capsule Commonly known as: PRILOSEC Take 1 capsule by mouth daily.  Indication: Gastroesophageal Reflux Disease   Victoza 18 MG/3ML Sopn Generic drug: liraglutide Inject into the skin.  Indication: Type 2 Diabetes        Follow-up Information     [facility] Follow up.   Specialty: Behavioral Health Why: New clients can come to our walk-in clinic in [place] three days a week: Tuesday, Wednesday, and Thursday from 9 am-12 pm (first come, first served). If you have any questions, please call our office at [phone].  Thanks! Contact information: [address] [place] [address] [phone]                 Follow-up recommendations:  Activity:  as tolerated Diet:  carb modified diet  Comments:  30-day scripts for Cymbalta with 1 refill sent to [facility] on [address]. If suicidal ideations return call 911 or proceed to nearest emergency room.   Signed: Jesse Sans, MD 01/06/2021, 9:53 AM

## 2021-01-06 NOTE — Progress Notes (Signed)
Patient reported some heightened anxiety at shift change yesterday morning and, via interpreter, has complained of the noise on the unit from the other patients and about one patient in particular whom she is afraid of because he walks around wearing a blanket and has been very intrusive and flirtatious with her. Today she is less anxious, denies SI, and while she still complained about the noise from the other patient she seems more annoyed by it than anxious. She says the doctor has told her she is going to be discharged today and she is happy about it. She used hand gestures and some English while waiting for the interpreter ipad to boot up indicating the noise on the unit was bothersome.

## 2021-01-06 NOTE — Progress Notes (Signed)
D: Pt alert and oriented. Pt denies experiencing any pain, SI/HI, or AVH at this time. Pt reports she will be able to keep herself safe when she returns home.   A: Pt received discharge and medication education/information. Pt belongings were returned and signed for at this time.   R: Pt verbalized understanding of discharge and medication education/information.  Pt escorted by staff to the medical mall front lobby where family picked the pt up from.

## 2021-01-06 NOTE — Progress Notes (Signed)
Recreation Therapy Notes  INPATIENT RECREATION TR PLAN  Patient Details Name: Amy Juarez MRN: [id number] DOB: 1980/07/06 Today's Date: 01/06/2021  Rec Therapy Plan Is patient appropriate for Therapeutic Recreation?: Yes Treatment times per week: at least 3 Estimated Length of Stay: 5-7 days TR Treatment/Interventions: Group participation (Comment)  Discharge Criteria Pt will be discharged from therapy if:: Discharged Treatment plan/goals/alternatives discussed and agreed upon by:: Patient/family  Discharge Summary Short term goals set: Patient will engage in groups without prompting or encouragement from LRT x3 group sessions within 5 recreation therapy group sessions Short term goals met: Adequate for discharge Progress toward goals comments: Groups attended Reason goals not met: Patient spent most of her time in her room Therapeutic equipment acquired: N/A Reason patient discharged from therapy: Discharge from hospital Pt/family agrees with progress & goals achieved: Yes Date patient discharged from therapy: 01/06/21   Amy Juarez 01/06/2021, 11:38 AM

## 2021-01-06 NOTE — BHH Suicide Risk Assessment (Signed)
[facility] Discharge Suicide Risk Assessment   Principal Problem: Major depressive disorder, recurrent severe without psychotic features [facility]) Discharge Diagnoses: Principal Problem:   Major depressive disorder, recurrent severe without psychotic features (HCC) Active Problems:   Suicide attempt (HCC)   Type 2 diabetes mellitus (HCC)   Total Time spent with patient: 35 minutes- 25 minutes face-to-face contact with patient, 10 minutes documentation, coordination of care, scripts   Musculoskeletal: Strength & Muscle Tone: within normal limits Gait & Station: normal Patient leans: N/A  Psychiatric Specialty Exam  Presentation  General Appearance: Appropriate for Environment; Casual  Eye Contact:Good  Speech:Clear and Coherent; Normal Rate  Speech Volume:Normal  Handedness:Right   Mood and Affect  Mood:Euthymic  Duration of Depression Symptoms: Greater than two weeks  Affect:Congruent   Thought Process  Thought Processes:Coherent  Descriptions of Associations:Intact  Orientation:Full (Time, Place and Person)  Thought Content:Logical  History of Schizophrenia/Schizoaffective disorder:No  Duration of Psychotic Symptoms:No data recorded Hallucinations:Hallucinations: None  Ideas of Reference:None  Suicidal Thoughts:Suicidal Thoughts: No  Homicidal Thoughts:Homicidal Thoughts: No   Sensorium  Memory:Immediate Good; Recent Good; Remote Good  Judgment:Intact  Insight:Present   Executive Functions  Concentration:Good  Attention Span:Good  Recall:Good  Fund of Knowledge:Good  Language:Good   Psychomotor Activity  Psychomotor Activity:Psychomotor Activity: Normal   Assets  Assets:Communication Skills; Desire for Improvement; Housing; Intimacy; Resilience; Social Support; Talents/Skills   Sleep  Sleep:Sleep: Good   Physical Exam: Physical Exam ROS Blood pressure 118/81, pulse 90, temperature 98.5 F (36.9 C), temperature source Oral, resp.  rate 18, height 5\' 6"  (1.676 m), weight 86.2 kg, SpO2 100 %. Body mass index is 30.67 kg/m.  Mental Status Per Nursing Assessment::   On Admission:  NA  Demographic Factors:  NA  Loss Factors: NA  Historical Factors: Prior suicide attempts  Risk Reduction Factors:   Sense of responsibility to family, Religious beliefs about death, Living with another person, especially a relative, Positive social support, Positive therapeutic relationship, and Positive coping skills or problem solving skills  Continued Clinical Symptoms:  Depression:   Recent sense of peace/wellbeing Previous Psychiatric Diagnoses and Treatments Medical Diagnoses and Treatments/Surgeries  Cognitive Features That Contribute To Risk:  None    Suicide Risk:  Minimal: No identifiable suicidal ideation.  Patients presenting with no risk factors but with morbid ruminations; may be classified as minimal risk based on the severity of the depressive symptoms   Follow-up Information     [facility] Follow up.   Specialty: Behavioral Health Why: New clients can come to our walk-in clinic in [phone] three days a week: Tuesday, Wednesday, and Thursday from 9 am-12 pm (first come, first served). If you have any questions, please call our office at [phone].  Thanks! Contact information: [address] [place] [phone]                 Plan Of Care/Follow-up recommendations:  Activity:  as tolerated Diet:  carb modified diet  [phone], MD 01/06/2021, 9:52 AM

## 2021-01-06 NOTE — Group Note (Signed)
LCSW Group Therapy Note  Patient discharged. Did not attend.  Corky Crafts, LCSWA 01/06/2021  1:28 PM

## 2021-01-06 NOTE — Progress Notes (Signed)
Recreation Therapy Notes  Date: 01/06/2021  Time: 10:00 am   Location: Craft room   Behavioral response: N/A   Intervention Topic: Time Management    Discussion/Intervention: Patient did not attend group.   Clinical Observations/Feedback:  Patient did not attend group.   Jendaya Gossett LRT/CTRS         Nashya Garlington 01/06/2021 11:06 AM

## 2021-01-06 NOTE — Progress Notes (Addendum)
  [facility] Adult Case Management Discharge Plan :  Will you be returning to the same living situation after discharge:  Yes,  Patient to return to place of residence listed in [place].  At discharge, do you have transportation home?: Yes,  Patient's spouse to provide transportation from hospital.  Do you have the ability to pay for your medications: No.  Release of information consent forms completed and in the chart;  Patient's signature needed at discharge.  Patient to Follow up at:  Follow-up [facility] Follow up.   Specialty: Behavioral Health Why: New clients can come to our walk-in clinic in [place] three days a week: Tuesday, Wednesday, and Thursday from 9 am-12 pm (first come, first served). If you have any questions, please call our office at [phone].  Thanks! Contact information: [place] [address] [place], [facility] Follow up.   Why: Please present for medication management appointment on Thursday October 6th (INPERSON) at 2:00PM.   Please present for counseling intake appointment on Tuesday October 4th (INPERSON) at 9:40AM. Contact information: [place]  [phone]                Next level of care provider has access to [facility] and Suicide Prevention discussed: Yes,  SPE completed with patient and sister.    Has patient been referred to the Quitline?: Patient refused referral  Patient has been referred for addiction treatment: N/A   CSW contacted [facility], by patient written consent, to schedule hospital discharge appointment and counseling intake appointment. Patient to see Margrett Rud for counseling and Felicity Coyer, PA for  medication management.   Request and Authorization for use / disclosure of protected health information form has been emailed to medical records office to process. Original copy  placed in outbox for pickup.   CSW met with patient at bedside to convey appointment information using Spanish interpreter Dayna [phone].   Durenda Hurt, LCSWA 01/06/2021, 10:51 AM

## 2021-01-06 NOTE — Plan of Care (Signed)
  Problem: Group Participation Goal: STG - Patient will engage in groups without prompting or encouragement from LRT x3 group sessions within 5 recreation therapy group sessions Description: STG - Patient will engage in groups without prompting or encouragement from LRT x3 group sessions within 5 recreation therapy group sessions 01/06/2021 1136 by Ernest Haber, LRT Outcome: Not Applicable 3/00/7622 [id number] by Ernest Haber, LRT Outcome: Not Met (add Reason) Note: Patient spent most of her time in her room.

## 2021-01-06 NOTE — Progress Notes (Signed)
D: Pt alert and oriented. Pt rates depression 0/10, hopelessness 0/10, and anxiety 0/10. Pt goal: "Seguivr como asia holl con mucha cneroia. Para sair a delante por mi familia." Pt reports energy level as normal and concentration as being good. Pt reports sleep last night as being good. Pt did not receive medications for sleep. Pt denies experiencing any pain at this time. Pt denies/ experiencing any SI/HI, or AVH at this time.   A: Scheduled medications administered to pt, per MD orders. Support and encouragement provided. Frequent verbal contact made. Routine safety checks conducted q15 minutes.   R: No adverse drug reactions noted. Pt verbally contracts for safety at this time. Pt complaint with medications and treatment plan. Pt interacts well with others on the unit. Pt remains safe at this time. Will continue to monitor.

## 2021-01-06 NOTE — Plan of Care (Signed)
  Problem: Education: Goal: Knowledge of [facility] Education information/materials will improve 01/06/2021 0504 by Billy Coast, RN Outcome: Progressing 01/06/2021 0504 by Billy Coast, RN Outcome: Progressing Goal: Emotional status will improve 01/06/2021 0504 by Billy Coast, RN Outcome: Progressing 01/06/2021 0504 by Billy Coast, RN Outcome: Progressing Goal: Mental status will improve 01/06/2021 0504 by Billy Coast, RN Outcome: Progressing 01/06/2021 0504 by Billy Coast, RN Outcome: Progressing Goal: Verbalization of understanding the information provided will improve 01/06/2021 0504 by Billy Coast, RN Outcome: Progressing 01/06/2021 0504 by Billy Coast, RN Outcome: Progressing   Problem: Activity: Goal: Interest or engagement in activities will improve 01/06/2021 0504 by Billy Coast, RN Outcome: Progressing 01/06/2021 0504 by Billy Coast, RN Outcome: Progressing Goal: Sleeping patterns will improve 01/06/2021 0504 by Billy Coast, RN Outcome: Progressing 01/06/2021 0504 by Billy Coast, RN Outcome: Progressing   Problem: Coping: Goal: Ability to verbalize frustrations and anger appropriately will improve Outcome: Progressing Goal: Ability to demonstrate self-control will improve Outcome: Progressing   Problem: Health Behavior/Discharge Planning: Goal: Identification of resources available to assist in meeting health care needs will improve Outcome: Progressing Goal: Compliance with treatment plan for underlying cause of condition will improve Outcome: Progressing   Problem: Physical Regulation: Goal: Ability to maintain clinical measurements within normal limits will improve Outcome: Progressing   Problem: Safety: Goal: Periods of time without injury will increase Outcome: Progressing   Problem: Education: Goal: Utilization of techniques to improve thought processes will  improve Outcome: Progressing Goal: Knowledge of the prescribed therapeutic regimen will improve Outcome: Progressing   Problem: Activity: Goal: Interest or engagement in leisure activities will improve Outcome: Progressing Goal: Imbalance in normal sleep/wake cycle will improve Outcome: Progressing   Problem: Coping: Goal: Coping ability will improve Outcome: Progressing Goal: Will verbalize feelings Outcome: Progressing   Problem: Health Behavior/Discharge Planning: Goal: Ability to make decisions will improve Outcome: Progressing Goal: Compliance with therapeutic regimen will improve Outcome: Progressing   Problem: Role Relationship: Goal: Will demonstrate positive changes in social behaviors and relationships Outcome: Progressing   Problem: Safety: Goal: Ability to disclose and discuss suicidal ideas will improve Outcome: Progressing Goal: Ability to identify and utilize support systems that promote safety will improve Outcome: Progressing   Problem: Self-Concept: Goal: Will verbalize positive feelings about self Outcome: Progressing Goal: Level of anxiety will decrease Outcome: Progressing   Problem: Education: Goal: Ability to state activities that reduce stress will improve Outcome: Progressing   Problem: Coping: Goal: Ability to identify and develop effective coping behavior will improve Outcome: Progressing   Problem: Self-Concept: Goal: Ability to identify factors that promote anxiety will improve Outcome: Progressing Goal: Level of anxiety will decrease Outcome: Progressing Goal: Ability to modify response to factors that promote anxiety will improve Outcome: Progressing

## 2021-01-12 ENCOUNTER — Other Ambulatory Visit: Payer: Self-pay

## 2022-02-05 ENCOUNTER — Ambulatory Visit: Payer: Self-pay

## 2022-02-05 DIAGNOSIS — Z23 Encounter for immunization: Secondary | ICD-10-CM

## 2022-02-08 ENCOUNTER — Other Ambulatory Visit: Payer: Self-pay

## 2022-02-08 ENCOUNTER — Emergency Department
Admission: EM | Admit: 2022-02-08 | Discharge: 2022-02-08 | Disposition: A | Discharge status: Home / Self Care | Payer: Self-pay | Attending: Emergency Medicine | Admitting: Emergency Medicine

## 2022-02-08 DIAGNOSIS — M5412 Radiculopathy, cervical region: Secondary | ICD-10-CM | POA: Insufficient documentation

## 2022-02-08 MED ORDER — NAPROXEN 500 MG PO TABS: 500.0000 mg | ORAL_TABLET | Freq: Two times a day (BID) | ORAL | 2 refills | Status: AC

## 2022-02-08 MED ORDER — KETOROLAC TROMETHAMINE 30 MG/ML IJ SOLN
30.0000 mg | Freq: Once | INTRAMUSCULAR | Status: AC
  Administered 2022-02-08: 30 mg via INTRAMUSCULAR
  Filled 2022-02-08: qty 1

## 2022-02-08 MED ORDER — CYCLOBENZAPRINE HCL 10 MG PO TABS: 10.0000 mg | ORAL_TABLET | Freq: Three times a day (TID) | ORAL | 0 refills | Status: AC | PRN

## 2022-02-08 NOTE — ED Triage Notes (Signed)
Pt to ED via POV from home. Pt reports right sided neck pain that started two weeks ago. Pt reports pain radiates to right arm. Pt denies injury.

## 2022-02-08 NOTE — ED Provider Triage Note (Signed)
Emergency Medicine Provider Triage Evaluation Note  Amy Juarez , a 41 y.o. female  was evaluated in triage.  Pt complains of nontraumatic right-sided neck pain that started about 2 weeks ago.  Pain radiates into the right arm and hand.  No alleviating measures attempted prior to arrival.  Physical Exam  There were no vitals taken for this visit. Gen:   Awake, no distress   Resp:  Normal effort  MSK:   Moves extremities without difficulty  Other:    Medical Decision Making  Medically screening exam initiated at 9:24 AM.  Appropriate orders placed.  Amy Juarez was informed that the remainder of the evaluation will be completed by another provider, this initial triage assessment does not replace that evaluation, and the importance of remaining in the ED until their evaluation is complete.   Amy Juarez, [place] 02/08/22 [phone]

## 2022-02-10 IMAGING — CT CT HEAD W/O CM
4 series · 16 of 47 positions shown, 18 images · non-contrast
Comparison: July 30, 2015

CLINICAL DATA: Altered mental status after medication.

EXAM:
CT HEAD WITHOUT CONTRAST
TECHNIQUE: Contiguous axial images were obtained from the base of the skull
through the vertex without intravenous contrast.

[Series 2: head wo · axial · 0.39mm/px · z∈[+394,+494]mm · 7 of 28 slices shown, 9 images]
[im 4/28  brain]
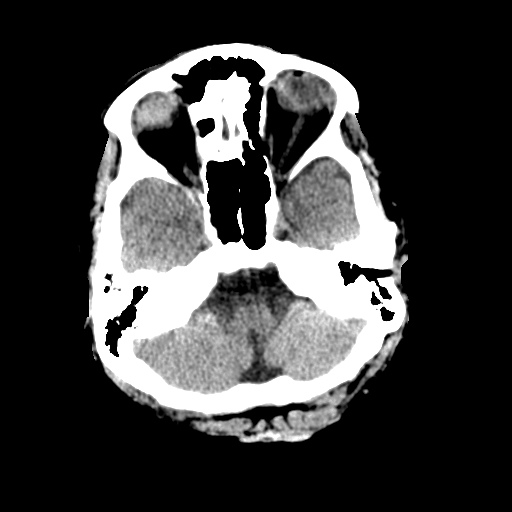
[im 4/28  bone]
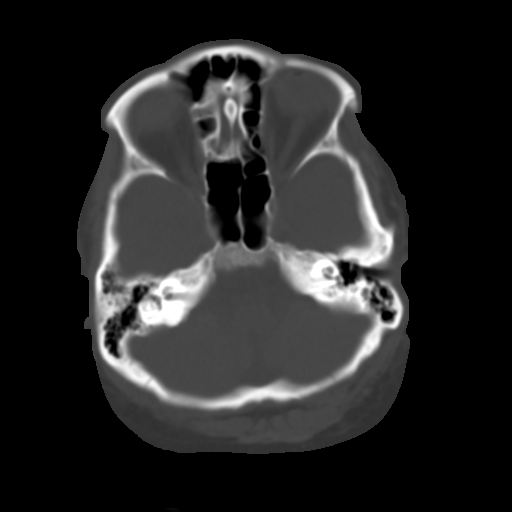
[im 7/28  brain]
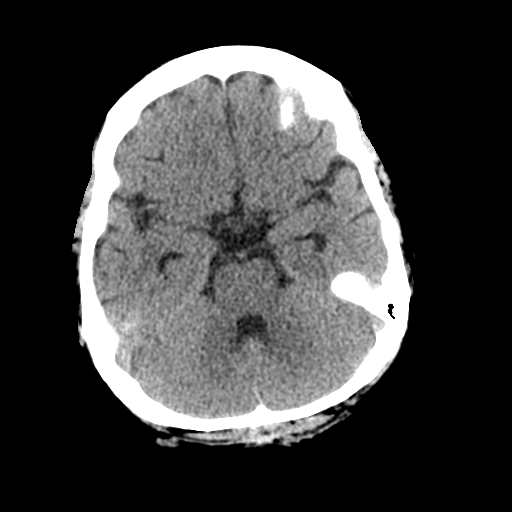
[im 11/28  brain]
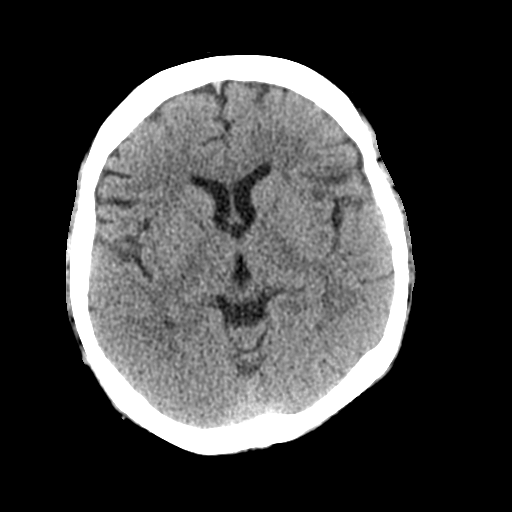
[im 14/28  brain]
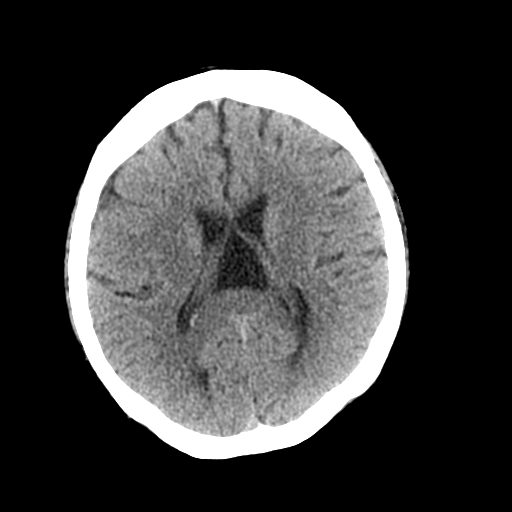
[im 17/28  brain]
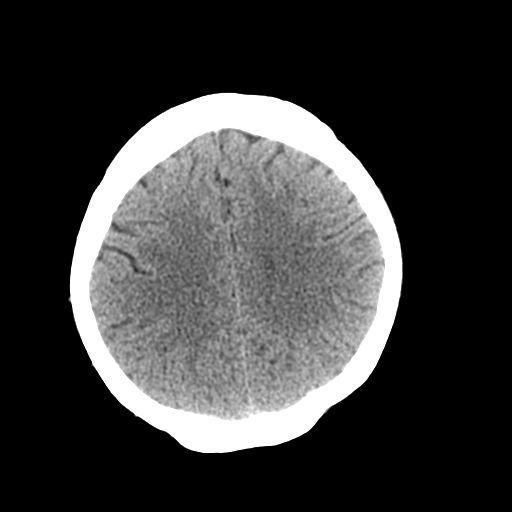
[im 17/28  bone]
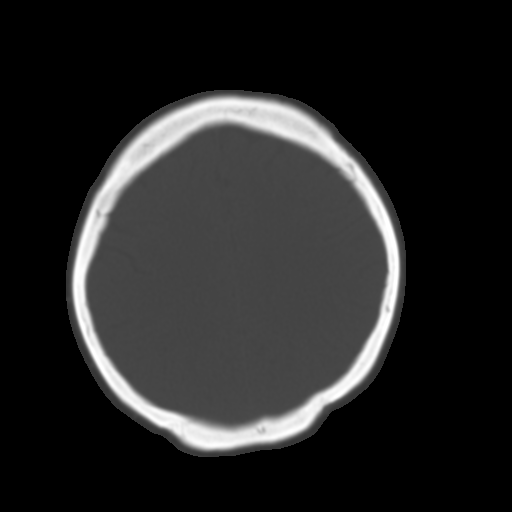
[im 21/28  brain]
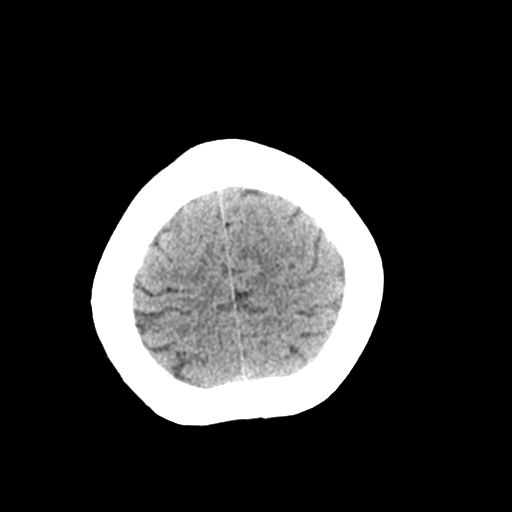
[im 24/28  brain]
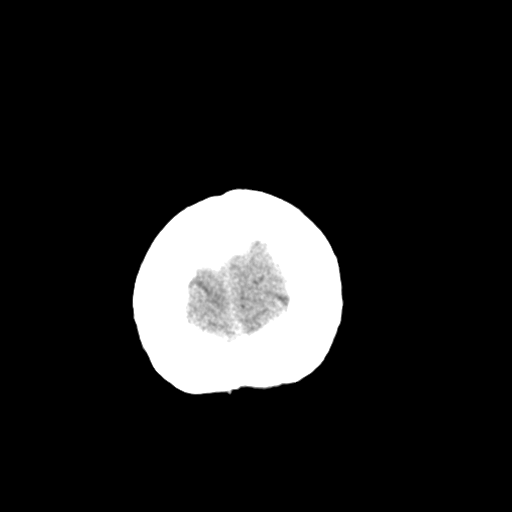

[Series 3: head bone · axial · 0.39mm/px · z∈[+391,+419]mm · 3 of 69 slices shown]
[im 7/69  bone]
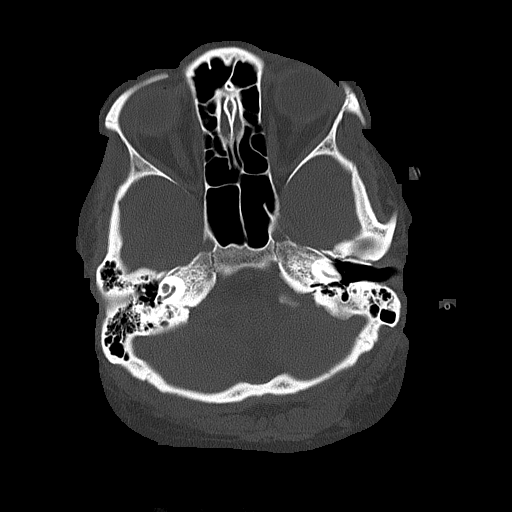
[im 14/69  bone]
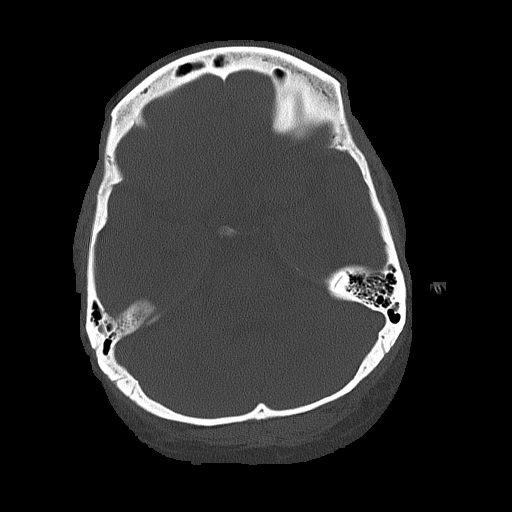
[im 21/69  bone]
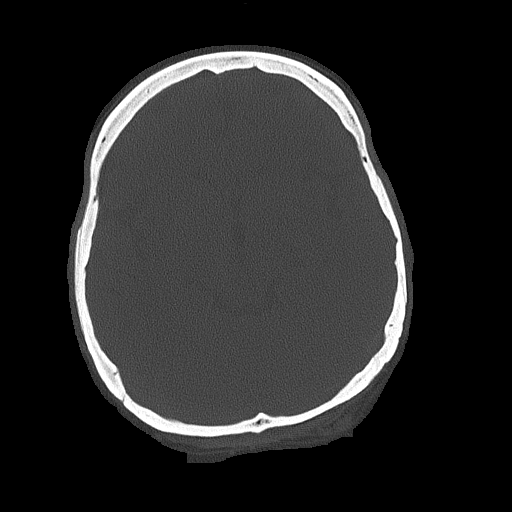

[Series 4: coronal soft tissue · coronal · 0.29mm/px · 3 of 53 slices shown]
[im 18/53  brain]
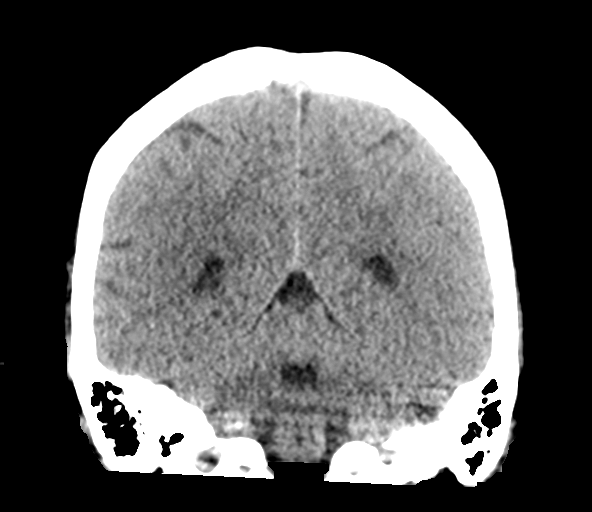
[im 24/53  brain]
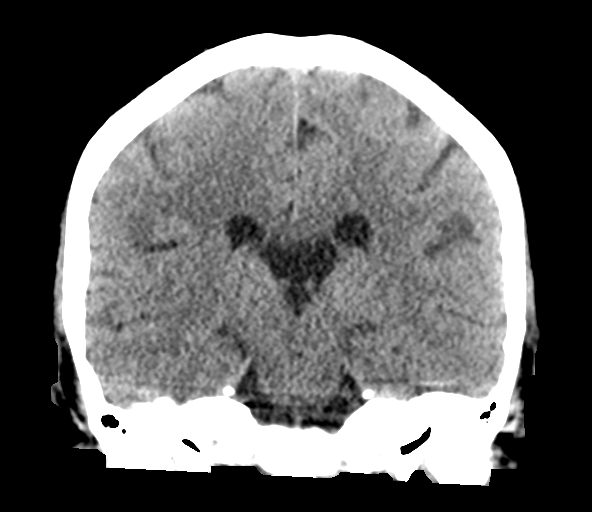
[im 29/53  brain]
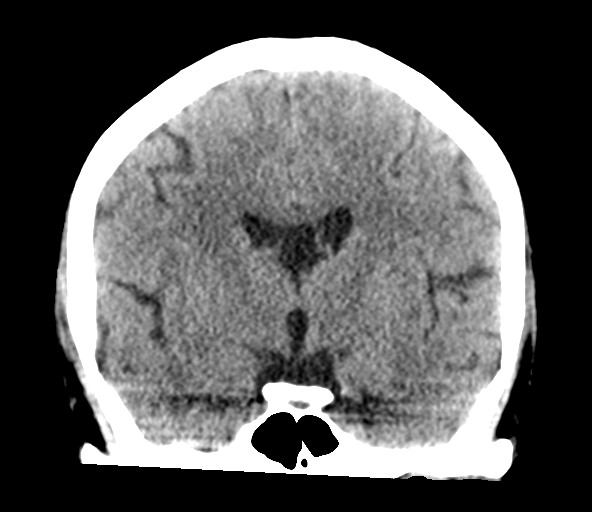

[Series 5: sagittal soft tissue · sagittal · 0.29mm/px · 3 of 52 slices shown]
[im 18/52  brain]
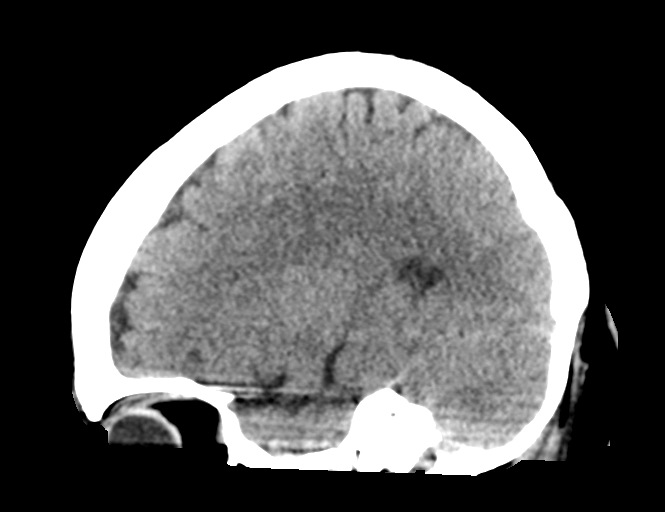
[im 26/52  brain]
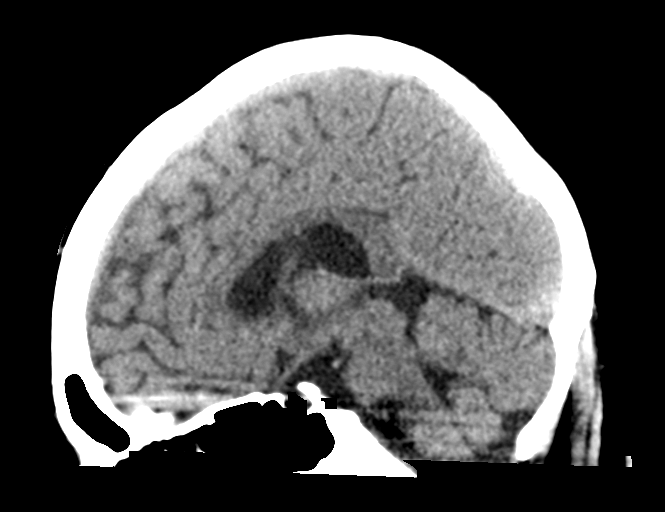
[im 35/52  brain]
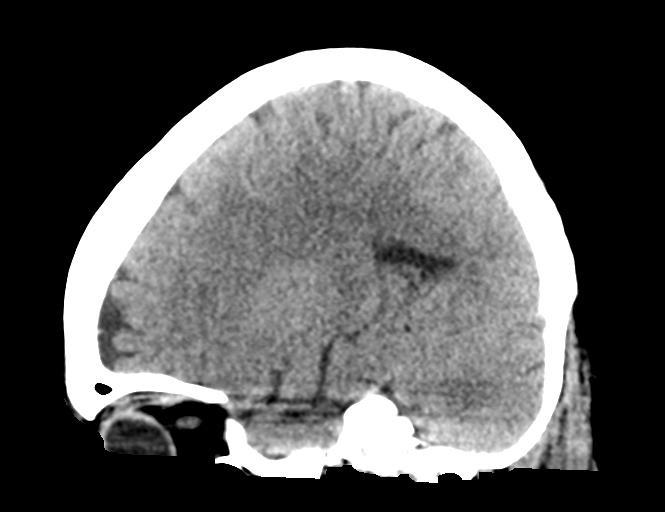

[16 of 47 positions shown; findings below may reference images not displayed]

FINDINGS: Brain: No evidence of acute infarction, hemorrhage, hydrocephalus,
extra-axial collection or mass lesion/mass effect.

Vascular: No hyperdense vessel or unexpected calcification.

Skull: Normal. Negative for fracture or focal lesion.

Sinuses/Orbits: No acute finding.

Other: None.
IMPRESSION: No acute intracranial pathology.

## 2022-02-10 IMAGING — CT CT CERVICAL SPINE W/O CM
3 of 4 series · 13 of 34 positions shown, 16 images · non-contrast
Comparison: None.

CLINICAL DATA: Altered mental status after medication.

EXAM:
CT CERVICAL SPINE WITHOUT CONTRAST
TECHNIQUE: Multidetector CT imaging of the cervical spine was performed without
intravenous contrast. Multiplanar CT image reconstructions were also
generated.

[Series 4: sagittal bone · sagittal · 0.27mm/px · 5 of 66 slices shown, 6 images]
[im 22/66  bone]
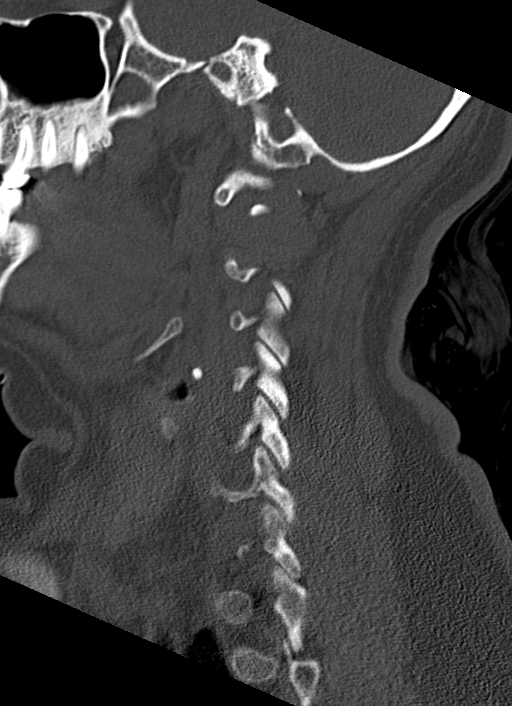
[im 28/66  bone]
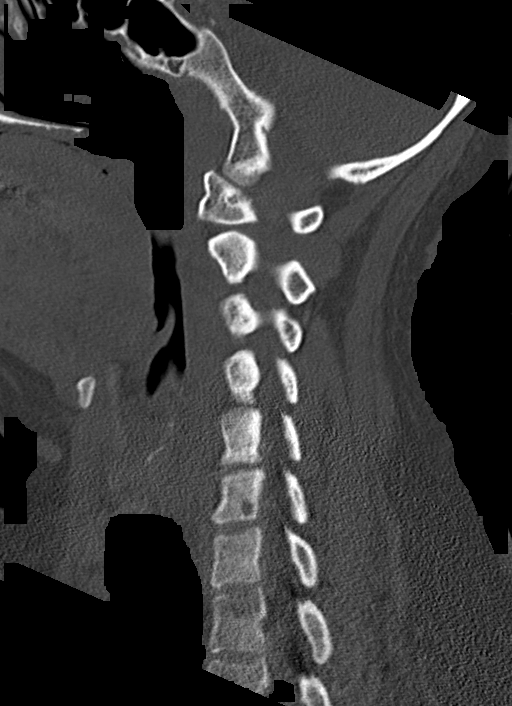
[im 33/66  soft-tissue]
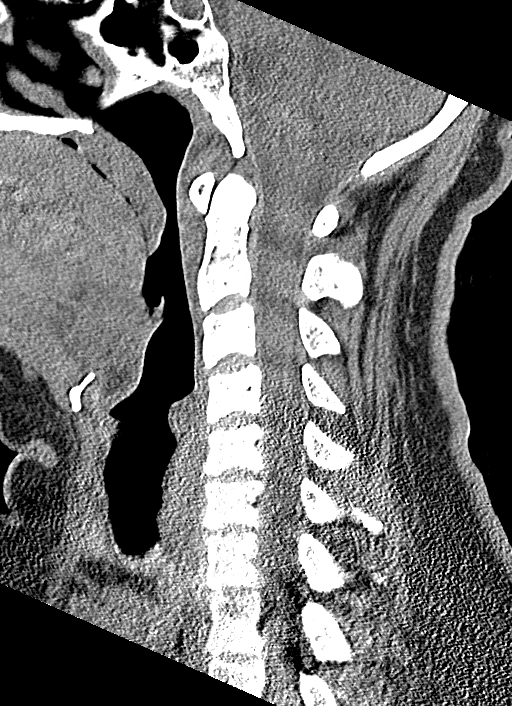
[im 33/66  bone]
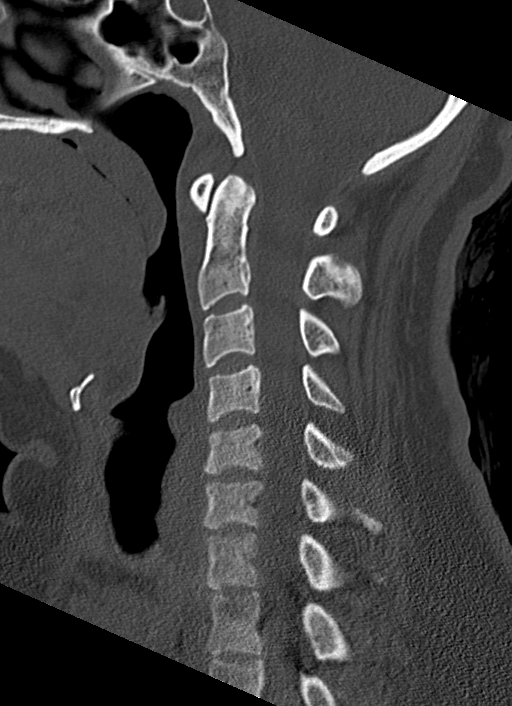
[im 38/66  bone]
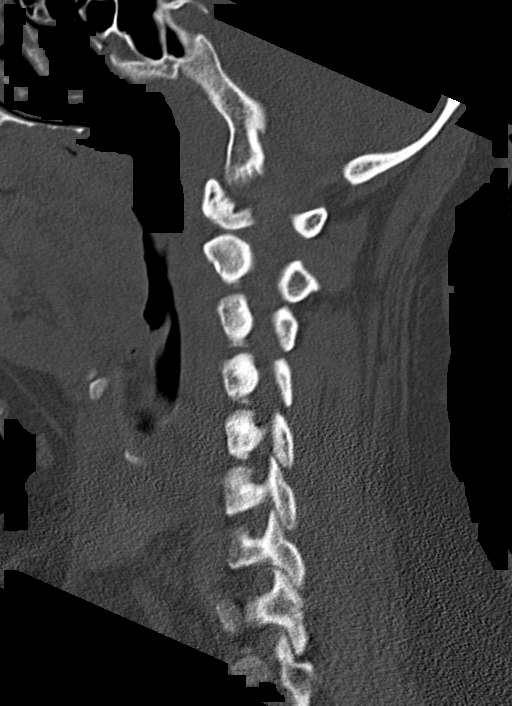
[im 44/66  bone]
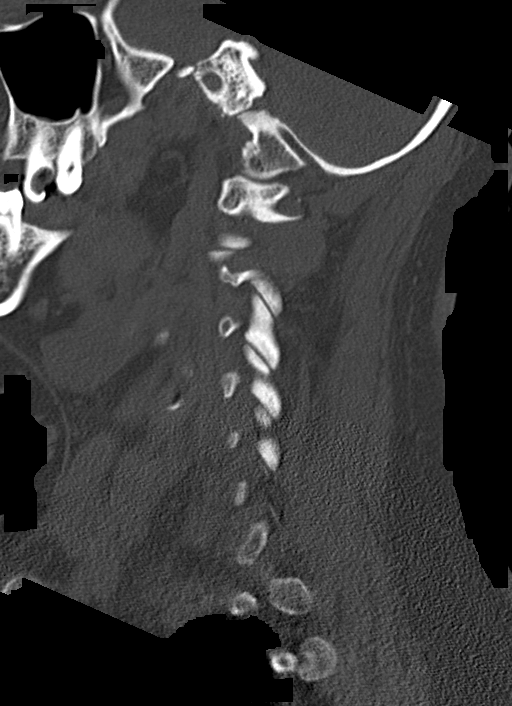

[Series 5: coronal bone · coronal · 0.25mm/px · 3 of 44 slices shown]
[im 9/44  bone]
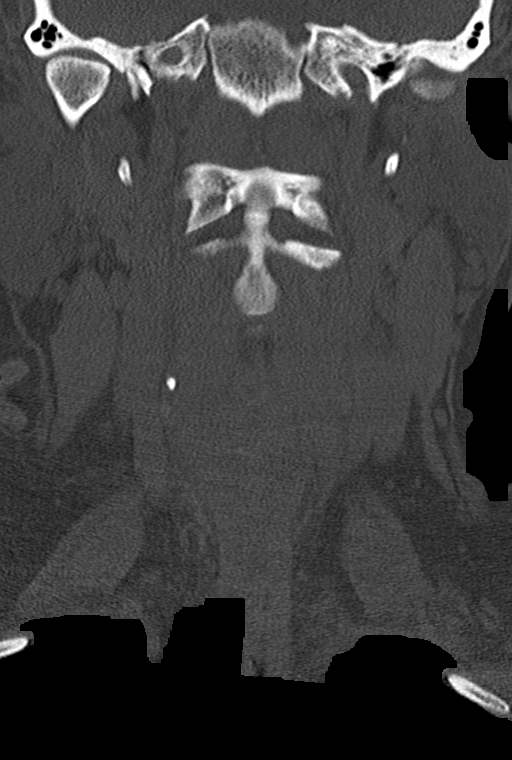
[im 18/44  bone]
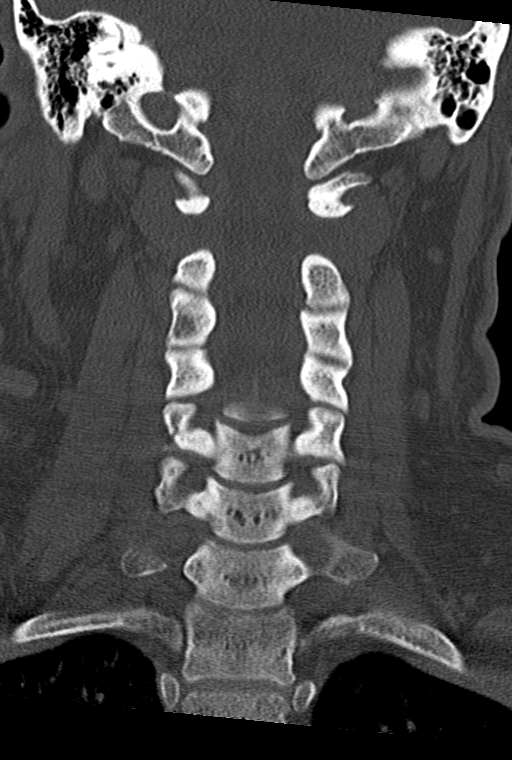
[im 26/44  bone]
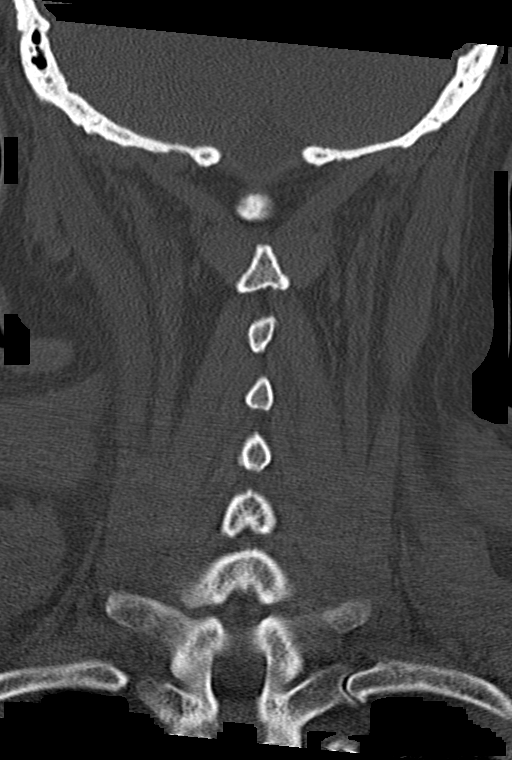

[Series 6: orthogonal bone · axial · 0.22mm/px · z∈[+223,+335]mm · 5 of 91 slices shown, 7 images]
[im 16/91  soft-tissue]
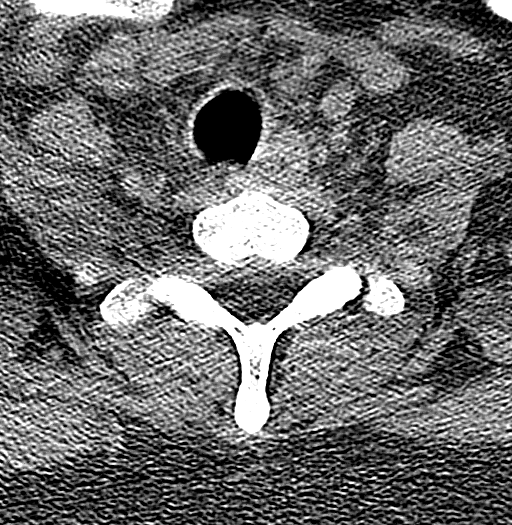
[im 16/91  bone]
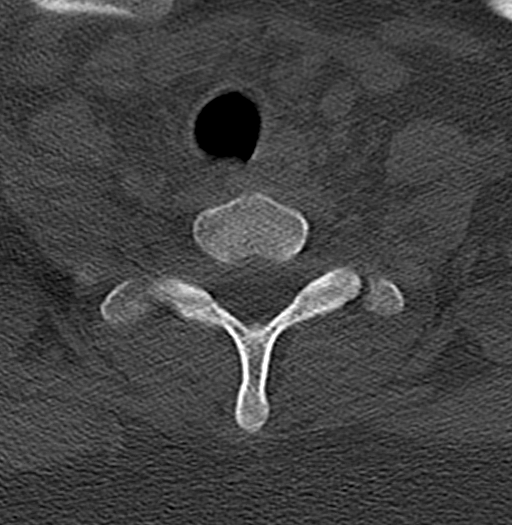
[im 31/91  bone]
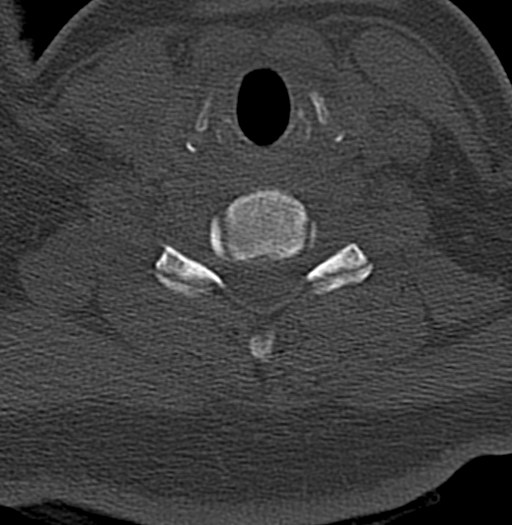
[im 46/91  bone]
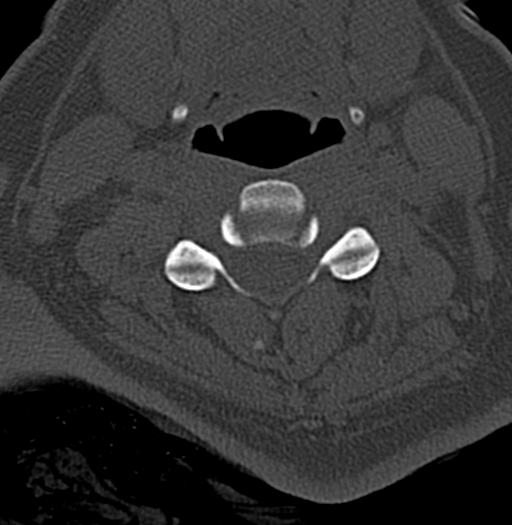
[im 61/91  bone]
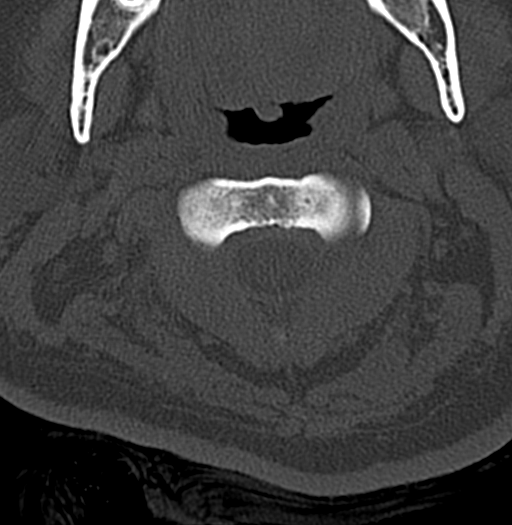
[im 76/91  soft-tissue]
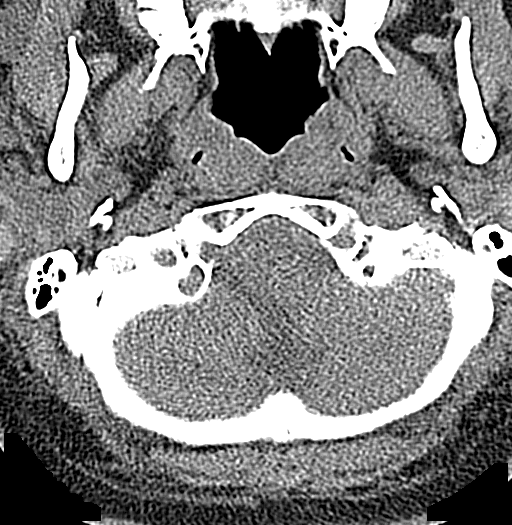
[im 76/91  bone]
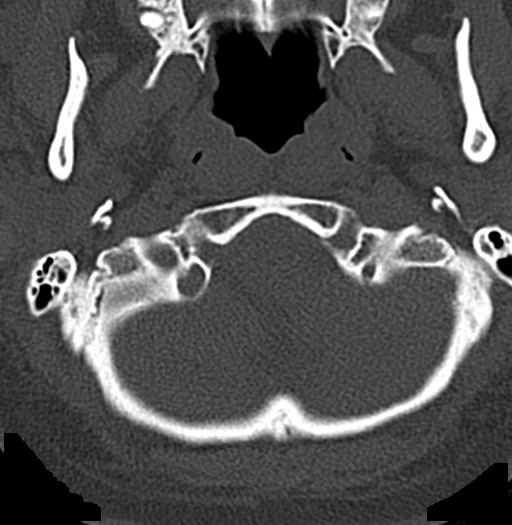

[13 of 34 positions shown; findings below may reference images not displayed]

FINDINGS: Alignment: Normal.

Skull base and vertebrae: No acute fracture. No primary bone lesion
or focal pathologic process.

Soft tissues and spinal canal: No prevertebral fluid or swelling. No
visible canal hematoma.

Disc levels: Normal multilevel endplates are seen with normal
multilevel intervertebral disc spaces.

Normal bilateral multilevel facet joints are noted.

Upper chest: Negative.

Other: None.
IMPRESSION: No acute fracture or subluxation of the cervical spine.

## 2022-02-16 NOTE — ED Provider Notes (Signed)
[facility] Provider Note    Event Date/Time   First MD Initiated Contact with Patient 02/08/22 [phone]     (approximate)   History   Neck Pain (Radiates to right arm )  Spanish interpreter used HPI  Amy Juarez is a 41 y.o. female who presents with complaints of right-sided neck pain with radiation to her right arm.  She denies weakness but has had some tingling sensations and pain in her right arm which seems to travel from her neck.  No injury to the area.  No fevers or chills.  No pharyngeal discomfort.  No headache     Physical Exam   Triage Vital Signs: ED Triage Vitals  Enc Vitals Group     BP 02/08/22 0930 (!) 145/92     Pulse Rate 02/08/22 0930 94     Resp 02/08/22 0930 18     Temp 02/08/22 0930 98.3 F (36.8 C)     Temp Source 02/08/22 0930 Oral     SpO2 02/08/22 0930 99 %     Weight 02/08/22 1055 86.2 kg (190 lb 0.6 oz)     Height 02/08/22 1055 1.676 m (5\' 6" )     Head Circumference --      Peak Flow --      Pain Score 02/08/22 0935 10     Pain Loc --      Pain Edu? --      Excl. in GC? --     Most recent vital signs: Vitals:   02/08/22 0930 02/08/22 1123  BP: (!) 145/92 (!) 140/88  Pulse: 94 88  Resp: 18 18  Temp: 98.3 F (36.8 C)   SpO2: 99% 99%     General: Awake, no distress.  CV:  Good peripheral perfusion.  Resp:  Normal effort.  Abd:  No distention.  Other:  No cervical vertebral tenderness to palpation, no pain with axial load.  Pulling arm does elicit the pain, normal strength, no neurodeficits.   ED Results / Procedures / Treatments   Labs (all labs ordered are listed, but only abnormal results are displayed) Labs Reviewed - No data to display   EKG     RADIOLOGY     PROCEDURES:  Critical Care performed:   Procedures   MEDICATIONS ORDERED IN ED: Medications  ketorolac (TORADOL) 30 MG/ML injection 30 mg (30 mg Intramuscular Given 02/08/22 1116)     IMPRESSION / MDM /  ASSESSMENT AND PLAN / ED COURSE  I reviewed the triage vital signs and the nursing notes. Patient's presentation is most consistent with acute, uncomplicated illness.   Patient presents with neck pain as detailed above, symptoms ongoing for over a week now.  No neurodeficits.  Differential includes cervical radiculopathy, muscle spasm, cervical sprain  We will treat conservatively with NSAIDs, Flexeril, strongly encouraged outpatient follow-up for additional work-up if no improvement     FINAL CLINICAL IMPRESSION(S) / ED DIAGNOSES   Final diagnoses:  Cervical radiculopathy     Rx / DC Orders   ED Discharge Orders          Ordered    naproxen (NAPROSYN) 500 MG tablet  2 times daily with meals        02/08/22 1049    cyclobenzaprine (FLEXERIL) 10 MG tablet  3 times daily PRN        02/08/22 1049             Note:  This document was prepared using Dragon  voice recognition software and may include unintentional dictation errors.   Lavonia Drafts, MD 02/16/22 1158

## 2022-12-10 DIAGNOSIS — F32A Depression, unspecified: Secondary | ICD-10-CM | POA: Diagnosis not present

## 2022-12-10 DIAGNOSIS — R10823 Right lower quadrant rebound abdominal tenderness: Secondary | ICD-10-CM | POA: Diagnosis not present

## 2022-12-10 DIAGNOSIS — K2970 Gastritis, unspecified, without bleeding: Secondary | ICD-10-CM | POA: Diagnosis not present

## 2022-12-10 DIAGNOSIS — E785 Hyperlipidemia, unspecified: Secondary | ICD-10-CM | POA: Diagnosis not present

## 2022-12-10 DIAGNOSIS — N3289 Other specified disorders of bladder: Secondary | ICD-10-CM | POA: Diagnosis not present

## 2022-12-10 DIAGNOSIS — E119 Type 2 diabetes mellitus without complications: Secondary | ICD-10-CM | POA: Diagnosis not present

## 2022-12-10 DIAGNOSIS — R1031 Right lower quadrant pain: Secondary | ICD-10-CM | POA: Diagnosis not present

## 2022-12-10 DIAGNOSIS — Z7984 Long term (current) use of oral hypoglycemic drugs: Secondary | ICD-10-CM | POA: Diagnosis not present

## 2022-12-10 DIAGNOSIS — R1032 Left lower quadrant pain: Secondary | ICD-10-CM | POA: Diagnosis not present
